# Patient Record
Sex: Female | Born: 1975 | Race: Black or African American | Hispanic: No | Marital: Single | Smoking: Never smoker
Health system: Southern US, Community
[De-identification: ages and names within clinical notes are randomized; demographics above are authoritative.]

## PROBLEM LIST (undated history)

## (undated) ENCOUNTER — Ambulatory Visit (HOSPITAL_COMMUNITY): Disposition: A | Discharge status: Other Acute Inpatient Hospital | Payer: PRIVATE HEALTH INSURANCE

## (undated) DIAGNOSIS — B009 Herpesviral infection, unspecified: Secondary | ICD-10-CM

## (undated) HISTORY — PX: BREAST BIOPSY: SHX20

## (undated) HISTORY — DX: Herpesviral infection, unspecified: B00.9

---

## 1999-09-24 ENCOUNTER — Encounter: Payer: Self-pay | Admitting: Family Medicine

## 1999-09-24 ENCOUNTER — Ambulatory Visit (HOSPITAL_COMMUNITY): Admission: RE | Admit: 1999-09-24 | Discharge: 1999-09-24 | Payer: Self-pay | Admitting: Family Medicine

## 1999-10-01 ENCOUNTER — Emergency Department (HOSPITAL_COMMUNITY): Admission: EM | Admit: 1999-10-01 | Discharge: 1999-10-02 | Payer: Self-pay | Admitting: Emergency Medicine

## 2001-02-12 ENCOUNTER — Other Ambulatory Visit: Admission: RE | Admit: 2001-02-12 | Discharge: 2001-02-12 | Payer: Self-pay | Admitting: Obstetrics and Gynecology

## 2002-05-17 ENCOUNTER — Other Ambulatory Visit: Admission: RE | Admit: 2002-05-17 | Discharge: 2002-05-17 | Payer: Self-pay | Admitting: Obstetrics and Gynecology

## 2003-11-24 ENCOUNTER — Other Ambulatory Visit: Admission: RE | Admit: 2003-11-24 | Discharge: 2003-11-24 | Payer: Self-pay | Admitting: Obstetrics and Gynecology

## 2005-04-15 ENCOUNTER — Other Ambulatory Visit: Admission: RE | Admit: 2005-04-15 | Discharge: 2005-04-15 | Payer: Self-pay | Admitting: Gynecology

## 2006-08-11 ENCOUNTER — Other Ambulatory Visit: Admission: RE | Admit: 2006-08-11 | Discharge: 2006-08-11 | Payer: Self-pay | Admitting: Gynecology

## 2006-08-24 ENCOUNTER — Emergency Department (HOSPITAL_COMMUNITY): Admission: EM | Admit: 2006-08-24 | Discharge: 2006-08-24 | Payer: Self-pay | Admitting: Emergency Medicine

## 2006-11-15 ENCOUNTER — Emergency Department (HOSPITAL_COMMUNITY): Admission: EM | Admit: 2006-11-15 | Discharge: 2006-11-15 | Payer: Self-pay | Admitting: Emergency Medicine

## 2007-12-07 ENCOUNTER — Other Ambulatory Visit: Admission: RE | Admit: 2007-12-07 | Discharge: 2007-12-07 | Payer: Self-pay | Admitting: Gynecology

## 2008-11-24 ENCOUNTER — Emergency Department (HOSPITAL_COMMUNITY): Admission: EM | Admit: 2008-11-24 | Discharge: 2008-11-24 | Payer: Self-pay | Admitting: Family Medicine

## 2008-11-28 ENCOUNTER — Ambulatory Visit: Payer: Self-pay | Admitting: Gynecology

## 2009-01-03 ENCOUNTER — Other Ambulatory Visit: Admission: RE | Admit: 2009-01-03 | Discharge: 2009-01-03 | Payer: Self-pay | Admitting: Gynecology

## 2009-01-03 ENCOUNTER — Encounter: Payer: Self-pay | Admitting: Gynecology

## 2009-01-03 ENCOUNTER — Ambulatory Visit: Payer: Self-pay | Admitting: Gynecology

## 2009-01-30 ENCOUNTER — Ambulatory Visit: Payer: Self-pay | Admitting: Gynecology

## 2009-02-14 ENCOUNTER — Ambulatory Visit: Payer: Self-pay | Admitting: Women's Health

## 2009-08-01 ENCOUNTER — Ambulatory Visit: Payer: Self-pay | Admitting: Gynecology

## 2009-10-17 ENCOUNTER — Ambulatory Visit: Payer: Self-pay | Admitting: Gynecology

## 2010-03-05 ENCOUNTER — Ambulatory Visit: Payer: Self-pay | Admitting: Gynecology

## 2010-03-05 ENCOUNTER — Other Ambulatory Visit: Admission: RE | Admit: 2010-03-05 | Discharge: 2010-03-05 | Payer: Self-pay | Admitting: Gynecology

## 2010-03-07 ENCOUNTER — Ambulatory Visit: Payer: Self-pay | Admitting: Gynecology

## 2010-07-10 ENCOUNTER — Ambulatory Visit: Payer: Self-pay | Admitting: Gynecology

## 2010-09-13 ENCOUNTER — Ambulatory Visit
Admission: RE | Admit: 2010-09-13 | Discharge: 2010-09-13 | Payer: Self-pay | Source: Home / Self Care | Attending: Gynecology | Admitting: Gynecology

## 2010-11-19 ENCOUNTER — Ambulatory Visit (INDEPENDENT_AMBULATORY_CARE_PROVIDER_SITE_OTHER): Payer: BC Managed Care – PPO | Admitting: Gynecology

## 2010-11-19 DIAGNOSIS — R823 Hemoglobinuria: Secondary | ICD-10-CM

## 2010-11-19 DIAGNOSIS — Z113 Encounter for screening for infections with a predominantly sexual mode of transmission: Secondary | ICD-10-CM

## 2010-11-19 DIAGNOSIS — N949 Unspecified condition associated with female genital organs and menstrual cycle: Secondary | ICD-10-CM

## 2010-11-19 DIAGNOSIS — N898 Other specified noninflammatory disorders of vagina: Secondary | ICD-10-CM

## 2010-11-19 DIAGNOSIS — B373 Candidiasis of vulva and vagina: Secondary | ICD-10-CM

## 2010-12-04 LAB — POCT URINALYSIS DIP (DEVICE)
Bilirubin Urine: NEGATIVE
Glucose, UA: NEGATIVE mg/dL
Nitrite: NEGATIVE
Protein, ur: 100 mg/dL — AB
Specific Gravity, Urine: >=1.03 (ref 1.005–1.030)
Urobilinogen, UA: 0.2 mg/dL (ref 0.0–1.0)
pH: 5.5 (ref 5.0–8.0)

## 2010-12-04 LAB — POCT PREGNANCY, URINE: Preg Test, Ur: NEGATIVE

## 2011-06-13 ENCOUNTER — Encounter: Payer: Self-pay | Admitting: Gynecology

## 2011-06-13 ENCOUNTER — Ambulatory Visit (INDEPENDENT_AMBULATORY_CARE_PROVIDER_SITE_OTHER): Payer: BC Managed Care – PPO | Admitting: Gynecology

## 2011-06-13 VITALS — BP 110/60

## 2011-06-13 DIAGNOSIS — N898 Other specified noninflammatory disorders of vagina: Secondary | ICD-10-CM

## 2011-06-13 DIAGNOSIS — B373 Candidiasis of vulva and vagina: Secondary | ICD-10-CM

## 2011-06-13 MED ORDER — FLUCONAZOLE 200 MG PO TABS: 200.0000 mg | ORAL_TABLET | Freq: Every day | ORAL | Status: AC

## 2011-06-13 NOTE — Progress Notes (Signed)
Patient presents complaining of several days worth of vaginal uncomfortable feeling. No real discharge or itching but said she feels like she's coming down with infection.  Exam Pelvic external BUS vagina with thick white discharge KOH wet prep done, bimanual without masses or tenderness  Wet prep positive for yeast  Assessment and plan: Yeast vaginitis treat with Diflucan 200 daily for 5 days. Patient has tried other regimens the past and this seems to work best for her. Follow up if symptoms persist or recur

## 2011-06-26 ENCOUNTER — Telehealth: Payer: Self-pay | Admitting: *Deleted

## 2011-06-26 MED ORDER — TERCONAZOLE 0.4 % VA CREA: 1.0000 | TOPICAL_CREAM | Freq: Every day | VAGINAL | Status: AC

## 2011-06-26 NOTE — Telephone Encounter (Signed)
Lm for pt to call

## 2011-06-26 NOTE — Telephone Encounter (Signed)
We will try Terazol 7 cream, 1 applicator at bedtime x7 days

## 2011-06-26 NOTE — Telephone Encounter (Signed)
Pt called stating yeast still there from last office visit on 06/13/11 she completed all the rx as directed. Pt would like something else to relieve. Please advise

## 2011-06-27 NOTE — Telephone Encounter (Signed)
Pt informed with the below note. 

## 2011-09-08 ENCOUNTER — Telehealth: Payer: Self-pay | Admitting: *Deleted

## 2011-09-08 MED ORDER — FLUCONAZOLE 200 MG PO TABS: 200.0000 mg | ORAL_TABLET | Freq: Every day | ORAL | Status: AC

## 2011-09-08 NOTE — Telephone Encounter (Signed)
Pt informed with the below note. 

## 2011-09-08 NOTE — Telephone Encounter (Signed)
Diflucan 200 mg daily x5 days. Office visit if symptoms persist 

## 2011-09-08 NOTE — Telephone Encounter (Signed)
Pt c/o yeast infection x 3 days now c/o white thick discharge. Pt has had treatment for yeast in past on 06/13/11 & 06/26/11 with Terazol 7 day cream and diflucan 150 mg # 5 tablets daily. Ov offered to pt but she said she has to work. Please advise

## 2011-11-26 ENCOUNTER — Other Ambulatory Visit: Payer: Self-pay | Admitting: Internal Medicine

## 2011-11-26 DIAGNOSIS — R300 Dysuria: Secondary | ICD-10-CM

## 2011-11-27 ENCOUNTER — Ambulatory Visit
Admission: RE | Admit: 2011-11-27 | Discharge: 2011-11-27 | Disposition: A | Discharge status: Home / Self Care | Payer: BC Managed Care – PPO | Source: Ambulatory Visit | Attending: Internal Medicine | Admitting: Internal Medicine

## 2011-11-27 DIAGNOSIS — R300 Dysuria: Secondary | ICD-10-CM

## 2013-02-23 ENCOUNTER — Ambulatory Visit (INDEPENDENT_AMBULATORY_CARE_PROVIDER_SITE_OTHER): Payer: BC Managed Care – PPO | Admitting: Gynecology

## 2013-02-23 ENCOUNTER — Other Ambulatory Visit (HOSPITAL_COMMUNITY)
Admission: RE | Admit: 2013-02-23 | Discharge: 2013-02-23 | Disposition: A | Discharge status: Home / Self Care | Payer: BC Managed Care – PPO | Source: Ambulatory Visit | Attending: Gynecology | Admitting: Gynecology

## 2013-02-23 ENCOUNTER — Encounter: Payer: Self-pay | Admitting: Gynecology

## 2013-02-23 VITALS — BP 110/60 | Ht 60.0 in | Wt 110.0 lb

## 2013-02-23 DIAGNOSIS — Z01419 Encounter for gynecological examination (general) (routine) without abnormal findings: Secondary | ICD-10-CM | POA: Insufficient documentation

## 2013-02-23 DIAGNOSIS — Z1151 Encounter for screening for human papillomavirus (HPV): Secondary | ICD-10-CM | POA: Insufficient documentation

## 2013-02-23 DIAGNOSIS — Z113 Encounter for screening for infections with a predominantly sexual mode of transmission: Secondary | ICD-10-CM

## 2013-02-23 DIAGNOSIS — Z309 Encounter for contraceptive management, unspecified: Secondary | ICD-10-CM

## 2013-02-23 MED ORDER — NORETHINDRONE ACET-ETHINYL EST 1-20 MG-MCG PO TABS: 1.0000 | ORAL_TABLET | Freq: Every day | ORAL | Status: DC

## 2013-02-23 NOTE — Patient Instructions (Addendum)
Followup if you do not start her period next week. Start on the oral contraceptives assuming you do start your period the Sunday following the start of your period. Followup if you have any issues.

## 2013-02-23 NOTE — Progress Notes (Signed)
Beth Sandoval Dec 03, 1975 [id number]        37 y.o.  G0P0 for annual exam.  Several issues noted below.  Past medical history,surgical history, medications, allergies, family history and social history were all reviewed and documented in the EPIC chart.  ROS:  Performed and pertinent positives and negatives are included in the history, assessment and plan .  Exam: Kim assistant Filed Vitals:   02/23/13 1512  BP: 110/60  Height: 5' (1.524 m)  Weight: 110 lb (49.896 kg)   General appearance  Normal Skin grossly normal Head/Neck normal with no cervical or supraclavicular adenopathy thyroid normal Lungs  clear Cardiac RR, without RMG Abdominal  soft, nontender, without masses, organomegaly or hernia Breasts  examined lying and sitting without masses, retractions, discharge or axillary adenopathy. Pelvic  Ext/BUS/vagina  normal   Cervix  normal Pap/HPV, GC/Chlamydia  Uterus  anteverted, normal size, shape and contour, midline and mobile nontender   Adnexa  Without masses or tenderness    Anus and perineum  normal      Assessment/Plan:  37 y.o. G0P0 female for annual exam.   1. Contraception. Patient had intercourse for the first time a week and a half ago and the condom broke. She did not use Plan B. Asked about pregnancy test. I've asked her to wait until her menses next week. She is still late then to followup for a pregnancy test. I reviewed that it's a little bit early right now and that may give [place] a false negative. Reviewed contraception I discussed all options to include pill patch ring Depo-Provera Implanon IUDs. Patient had taken oral contraceptives in the past and did well with these. I reviewed the risks include stroke heart attack DVT. She's not been followed for any medical issues and otherwise healthy. Loestrin 120 equivalents prescribed start after her next menses. She will again followup next week if she does not start spontaneously. 2. STD screening. Patient requests GC  chlamydia screening test to be sure. No known exposure. 3. Pap smear 2011. Pap/HPV today. No history of significant abnormal Pap smears previously. 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 She asked about transmission risk to her new partner. I reviewed that certainly she can transmit asymptomatically. The option for daily suppressive Valtrex reviewed. Issues that does not guarantee that she will not transmit despite taking the Valtrex discussed. Patient's not interested in taking at this time. 5. Mammography. Screening mammographic recommendations between 35 and 40 were reviewed. Patient has a strong family history prefers to wait closer to 40. SBE monthly reviewed. 6. Health maintenance. We'll check urinalysis. Otherwise no blood work done as it is all done through her primary physician's office. Assuming that she starts her menses and gets started on the birth control pills then she will see me in a year, sooner as needed.  Note: This document was prepared with a combination of digital dictation and possible smart phrase technology. Any transcriptional errors that result from this process are unintentional.   Dara Lords MD, 3:59 PM 02/23/2013

## 2013-02-24 ENCOUNTER — Encounter: Payer: Self-pay | Admitting: Gynecology

## 2013-02-24 LAB — URINALYSIS W MICROSCOPIC + REFLEX CULTURE
Casts: NONE SEEN
Crystals: NONE SEEN
Leukocytes, UA: NEGATIVE
Nitrite: NEGATIVE
Specific Gravity, Urine: 1.016 (ref 1.005–1.030)
Squamous Epithelial / LPF: NONE SEEN
Urobilinogen, UA: 0.2 mg/dL (ref 0.0–1.0)
pH: 6.5 (ref 5.0–8.0)

## 2013-02-24 LAB — GC/CHLAMYDIA PROBE AMP: GC Probe RNA: NEGATIVE

## 2013-07-07 ENCOUNTER — Encounter: Payer: Self-pay | Admitting: Gynecology

## 2013-07-07 ENCOUNTER — Ambulatory Visit (INDEPENDENT_AMBULATORY_CARE_PROVIDER_SITE_OTHER): Payer: BC Managed Care – PPO | Admitting: Gynecology

## 2013-07-07 DIAGNOSIS — R3129 Other microscopic hematuria: Secondary | ICD-10-CM

## 2013-07-07 DIAGNOSIS — R350 Frequency of micturition: Secondary | ICD-10-CM

## 2013-07-07 DIAGNOSIS — N898 Other specified noninflammatory disorders of vagina: Secondary | ICD-10-CM

## 2013-07-07 LAB — URINALYSIS W MICROSCOPIC + REFLEX CULTURE
Casts: NONE SEEN
Crystals: NONE SEEN
Leukocytes, UA: NEGATIVE
Nitrite: NEGATIVE
Specific Gravity, Urine: 1.025 (ref 1.005–1.030)
Urobilinogen, UA: 0.2 mg/dL (ref 0.0–1.0)
pH: 7 (ref 5.0–8.0)

## 2013-07-07 LAB — WET PREP FOR TRICH, YEAST, CLUE

## 2013-07-07 MED ORDER — METRONIDAZOLE 500 MG PO TABS: 500.0000 mg | ORAL_TABLET | Freq: Two times a day (BID) | ORAL | Status: DC

## 2013-07-07 NOTE — Patient Instructions (Signed)
Take Flagyl medication twice daily for 7 days. Avoid alcohol while taking. Repeat a clean catch urinalysis in several weeks to make sure the microscopic blood in your urine goes away.

## 2013-07-07 NOTE — Progress Notes (Signed)
Patient presents with 2 days of vaginal irritation and slight discharge. Also with some suprapubic discomfort. No dysuria frequency urgency low back pain fever chills nausea vomiting diarrhea constipation.  Exam with Berenice Bouton Spine straight with no CVA tenderness. Abdomen soft nontender without masses guarding rebound organomegaly. Pelvic external BUS vagina with white discharge. Cervix normal. Uterus normal size midline mobile nontender. Adnexa without masses or tenderness.  Assessment and plan: Wet prep consistent with bacterial vaginosis. Urinalysis shows microscopic hematuria in 7-10 RBCs per HPF many bacteria. No white cells. Will culture. Treat with Flagyl 500 mg twice a day x7 days at patient's choice. Alcohol avoidance reviewed. Followup on urine culture. I did ask her regardless to recheck a clean catch urinalysis in several weeks to make sure the microscopic hematuria clears and she agrees to do so.

## 2013-07-08 LAB — URINE CULTURE: Organism ID, Bacteria: NO GROWTH

## 2014-02-18 ENCOUNTER — Encounter (HOSPITAL_COMMUNITY): Payer: Self-pay | Admitting: Emergency Medicine

## 2014-02-18 ENCOUNTER — Emergency Department (INDEPENDENT_AMBULATORY_CARE_PROVIDER_SITE_OTHER)
Admission: EM | Admit: 2014-02-18 | Discharge: 2014-02-18 | Disposition: A | Discharge status: Home / Self Care | Payer: Self-pay | Source: Home / Self Care | Attending: Emergency Medicine | Admitting: Emergency Medicine

## 2014-02-18 DIAGNOSIS — IMO0002 Reserved for concepts with insufficient information to code with codable children: Secondary | ICD-10-CM

## 2014-02-18 DIAGNOSIS — S91109A Unspecified open wound of unspecified toe(s) without damage to nail, initial encounter: Secondary | ICD-10-CM

## 2014-02-18 DIAGNOSIS — S91209A Unspecified open wound of unspecified toe(s) with damage to nail, initial encounter: Secondary | ICD-10-CM

## 2014-02-18 MED ORDER — HYDROCODONE-ACETAMINOPHEN 5-325 MG PO TABS: ORAL_TABLET | ORAL | Status: DC

## 2014-02-18 NOTE — ED Provider Notes (Signed)
Chief Complaint   Chief Complaint  Patient presents with  . Toe Injury    History of Present Illness   Beth Sandoval is a 38 year old female who a week ago partially avulsed the toenail of her right great toe. Today she stubbed it again, reinjuring it, and avulsing if further. The toenail is very loose and it hurts to palpation. There's been a small amount of bleeding from underneath the toenail. She tried to repair this herself by gluing it back on but this did not seem to work. There is no erythema, swelling, or pain to palpation of the toe itself.  Review of Systems   Other than as noted above, the patient denies any of the following symptoms: Systemic:  No fevers or chills. Musculoskeletal:  No joint pain or arthritis.  Neurological:  No muscular weakness, paresthesias.   [place]   Past medical history, family history, social history, meds, and allergies were reviewed.     Physical  Examination     Vital signs:  BP 121/70  Pulse 68  Temp(Src) 98.4 F (36.9 C) (Oral)  Resp 20  SpO2 100%  LMP 01/20/2014 Gen:  Alert and oriented times 3.  In no distress. Musculoskeletal:  Exam of the foot reveals the right great toenail is very loose, and painful to palpation. There is no purulent drainage and no surrounding erythema.  Otherwise, all joints had a full a ROM with no swelling, bruising or deformity.  No edema, pulses full. Extremities were warm and pink.  Capillary refill was brisk.  Skin:  Clear, warm and dry.  No rash. Neuro:  Alert and oriented times 3.  Muscle strength was normal.  Sensation was intact to light touch.   Procedure Note:  Verbal informed consent was obtained from the patient.  Risks and benefits were outlined with the patient.  Patient understands and accepts these risks. A time out was called and the procedure and identity of the patient were confirmed verbally.    The procedure was then performed as follows:  The toe was prepped with alcohol and anesthetized  with a ring block with 4 injections of 2% Xylocaine without epinephrine. After satisfactory local anesthesia was obtained. The nail was elevated with nail elevator and was then completely removed with a mosquito forceps. A small amount bleeding was stopped with packing cautery. Antibiotic ointment was applied and a nonstick dressing. She was given a postoperative shoe.  The patient tolerated the procedure well without any immediate complications.   Assessment   The encounter diagnosis was Toenail avulsion, initial encounter.  No evidence of infection.  Plan    1.  Meds:  The following meds were prescribed:   Discharge Medication List as of 02/18/2014  7:31 PM    START taking these medications   Details  HYDROcodone-acetaminophen (NORCO/VICODIN) 5-325 MG per tablet 1 to 2 tabs every 4 to 6 hours as needed for pain., Print        2.  Patient Education/Counseling:  The patient was given appropriate handouts, self care instructions, and instructed in symptomatic relief including rest and elevation. She was instructed in wound care.    3.  Follow up:  The patient was told to follow up here if no better in 3 to 4 days, or sooner if becoming worse in any way, and given some red flag symptoms such as worsening pain, or any evidence of infection which would prompt immediate return.  Follow up here as necessary.  Harden Mo, MD 02/18/14 2030

## 2014-02-18 NOTE — ED Notes (Signed)
Dressing placed per Dr. Jake Michaelis.

## 2014-02-18 NOTE — Discharge Instructions (Signed)
Soak in warm [facility] water 3 times daily, apply antiobiotic ointment and a Band Aid.

## 2014-02-18 NOTE — ED Notes (Signed)
Assessment per Dr. Jake Michaelis.

## 2015-01-03 ENCOUNTER — Encounter: Payer: Self-pay | Admitting: Gynecology

## 2015-02-27 ENCOUNTER — Encounter: Payer: Self-pay | Admitting: Gynecology

## 2015-03-21 ENCOUNTER — Encounter: Payer: Self-pay | Admitting: Women's Health

## 2015-05-17 ENCOUNTER — Ambulatory Visit (INDEPENDENT_AMBULATORY_CARE_PROVIDER_SITE_OTHER): Payer: 59 | Admitting: Women's Health

## 2015-05-17 ENCOUNTER — Encounter: Payer: Self-pay | Admitting: Women's Health

## 2015-05-17 VITALS — BP 115/80 | Ht 60.0 in | Wt 115.0 lb

## 2015-05-17 DIAGNOSIS — R350 Frequency of micturition: Secondary | ICD-10-CM

## 2015-05-17 DIAGNOSIS — B373 Candidiasis of vulva and vagina: Secondary | ICD-10-CM

## 2015-05-17 DIAGNOSIS — N898 Other specified noninflammatory disorders of vagina: Secondary | ICD-10-CM | POA: Diagnosis not present

## 2015-05-17 DIAGNOSIS — B3731 Acute candidiasis of vulva and vagina: Secondary | ICD-10-CM

## 2015-05-17 LAB — URINALYSIS W MICROSCOPIC + REFLEX CULTURE
BILIRUBIN URINE: NEGATIVE
CASTS: NONE SEEN [LPF]
GLUCOSE, UA: NEGATIVE
Leukocytes, UA: NEGATIVE
Nitrite: NEGATIVE
PROTEIN: NEGATIVE
Specific Gravity, Urine: 1.02 (ref 1.001–1.035)
WBC UA: NONE SEEN WBC/HPF (ref <=5)
Yeast: NONE SEEN [HPF]
pH: 7.5 (ref 5.0–8.0)

## 2015-05-17 LAB — WET PREP FOR TRICH, YEAST, CLUE
CLUE CELLS WET PREP: NONE SEEN
TRICH WET PREP: NONE SEEN

## 2015-05-17 MED ORDER — FLUCONAZOLE 150 MG PO TABS: 150.0000 mg | ORAL_TABLET | Freq: Once | ORAL | Status: DC

## 2015-05-17 NOTE — Patient Instructions (Signed)

## 2015-05-17 NOTE — Progress Notes (Signed)
Patient ID: Beth Sandoval, female   DOB: [birth year], 39 y.o.   MRN: [id number] Presents with complaint of increased vaginal discharge with mild itching, no odor. Not sexually active in greater than one year denies need for STD screen. Regular monthly cycle. Denies urinary symptoms, abdominal pain or fever.  Exam: Appears well. External genitalia within normal limits, speculum exam moderate amount of a curdy discharge noted wet prep positive for yeast. Bimanual no CMT or adnexal tenderness. Uterus small.  Yeast vaginitis  Plan: Diflucan 150 by mouth times one dose with refill. Keep scheduled annual exam visit. yeast prevention discussed. Call if no relief.

## 2015-05-18 LAB — URINE CULTURE
COLONY COUNT: NO GROWTH
Organism ID, Bacteria: NO GROWTH

## 2015-06-13 ENCOUNTER — Ambulatory Visit (INDEPENDENT_AMBULATORY_CARE_PROVIDER_SITE_OTHER): Payer: 59 | Admitting: Women's Health

## 2015-06-13 ENCOUNTER — Encounter: Payer: Self-pay | Admitting: Women's Health

## 2015-06-13 ENCOUNTER — Other Ambulatory Visit: Payer: Self-pay | Admitting: Women's Health

## 2015-06-13 VITALS — BP 124/80 | Ht 60.0 in | Wt 115.0 lb

## 2015-06-13 DIAGNOSIS — Z01419 Encounter for gynecological examination (general) (routine) without abnormal findings: Secondary | ICD-10-CM

## 2015-06-13 LAB — LIPID PANEL
CHOL/HDL RATIO: 2.8 ratio (ref <=5.0)
Cholesterol: 178 mg/dL (ref 125–200)
HDL: 63 mg/dL (ref >=46)
LDL CALC: 106 mg/dL (ref <130)
Triglycerides: 43 mg/dL (ref <150)
VLDL: 9 mg/dL (ref <30)

## 2015-06-13 LAB — CBC WITH DIFFERENTIAL/PLATELET
BASOS ABS: 0 10*3/uL (ref 0.0–0.1)
BASOS PCT: 0 % (ref 0–1)
EOS ABS: 0 10*3/uL (ref 0.0–0.7)
EOS PCT: 1 % (ref 0–5)
HCT: 37.6 % (ref 36.0–46.0)
Hemoglobin: 11.7 g/dL — ABNORMAL LOW (ref 12.0–15.0)
LYMPHS ABS: 2.1 10*3/uL (ref 0.7–4.0)
Lymphocytes Relative: 46 % (ref 12–46)
MCH: 27.5 pg (ref 26.0–34.0)
MCHC: 31.1 g/dL (ref 30.0–36.0)
MCV: 88.3 fL (ref 78.0–100.0)
MONOS PCT: 6 % (ref 3–12)
MPV: 9.1 fL (ref 8.6–12.4)
Monocytes Absolute: 0.3 10*3/uL (ref 0.1–1.0)
NEUTROS PCT: 47 % (ref 43–77)
Neutro Abs: 2.2 10*3/uL (ref 1.7–7.7)
PLATELETS: 326 10*3/uL (ref 150–400)
RBC: 4.26 MIL/uL (ref 3.87–5.11)
RDW: 13.5 % (ref 11.5–15.5)
WBC: 4.6 10*3/uL (ref 4.0–10.5)

## 2015-06-13 LAB — COMPREHENSIVE METABOLIC PANEL
ALT: 10 U/L (ref 6–29)
AST: 15 U/L (ref 10–30)
Albumin: 4.2 g/dL (ref 3.6–5.1)
Alkaline Phosphatase: 46 U/L (ref 33–115)
BUN: 14 mg/dL (ref 7–25)
CHLORIDE: 103 mmol/L (ref 98–110)
CO2: 28 mmol/L (ref 20–31)
CREATININE: 0.81 mg/dL (ref 0.50–1.10)
Calcium: 9 mg/dL (ref 8.6–10.2)
GLUCOSE: 71 mg/dL (ref 65–99)
POTASSIUM: 4.2 mmol/L (ref 3.5–5.3)
SODIUM: 136 mmol/L (ref 135–146)
Total Bilirubin: 0.3 mg/dL (ref 0.2–1.2)
Total Protein: 7.1 g/dL (ref 6.1–8.1)

## 2015-06-13 NOTE — Progress Notes (Signed)
Beth Sandoval 08-24-1976 [id number]    History:    Presents for annual exam.  Regular monthly cycle not sexually active, waiting for marriage. Normal Pap history. Has not had a mammogram.  Past medical history, past surgical history, family history and social history were all reviewed and documented in the EPIC chart. Works in [employer]. Planning to move to [place]. Mother hypertension.  ROS:  A ROS was performed and pertinent positives and negatives are included.  Exam:  Filed Vitals:   06/13/15 1438  BP: 124/80    General appearance:  Normal Thyroid:  Symmetrical, normal in size, without palpable masses or nodularity. Respiratory  Auscultation:  Clear without wheezing or rhonchi Cardiovascular  Auscultation:  Regular rate, without rubs, murmurs or gallops  Edema/varicosities:  Not grossly evident Abdominal  Soft,nontender, without masses, guarding or rebound.  Liver/spleen:  No organomegaly noted  Hernia:  None appreciated  Skin  Inspection:  Grossly normal   Breasts: Examined lying and sitting.     Right: Without masses, retractions, discharge or axillary adenopathy.     Left: Without masses, retractions, discharge or axillary adenopathy. Gentitourinary   Inguinal/mons:  Normal without inguinal adenopathy  External genitalia:  Normal  BUS/Urethra/Skene's glands:  Normal  Vagina:  Normal  Cervix:  Normal  Uterus:   normal in size, shape and contour.  Midline and mobile  Adnexa/parametria:     Rt: Without masses or tenderness.   Lt: Without masses or tenderness.  Anus and perineum: Normal  Digital rectal exam: Normal sphincter tone without palpated masses or tenderness  Assessment/Plan:  39 y.o. SBF G0 for annual exam with no complaints.  Regular monthly cycle/not sexually active  Plan: Contraception options reviewed and declined. SBE's, annual screening mammogram, instructed to schedule breast center information given. Regular exercise, calcium rich diet, MVI  daily encouraged. CBC, lipid panel, CMP, rubella titer, UA, Pap normal with negative HR HPV 2014, new screening guidelines reviewed.  [place], 4:30 PM 06/13/2015

## 2015-06-13 NOTE — Patient Instructions (Signed)
Health Maintenance, Female Adopting a healthy lifestyle and getting preventive care can go a long way to promote health and wellness. Talk with your health care provider about what schedule of regular examinations is right for you. This is a good chance for you to check in with your provider about disease prevention and staying healthy. In between checkups, there are plenty of things you can do on your own. Experts have done a lot of research about which lifestyle changes and preventive measures are most likely to keep you healthy. Ask your health care provider for more information. WEIGHT AND DIET  Eat a healthy diet  Be sure to include plenty of vegetables, fruits, low-fat dairy products, and lean protein.  Do not eat a lot of foods high in solid fats, added sugars, or salt.  Get regular exercise. This is one of the most important things you can do for your health.  Most adults should exercise for at least 150 minutes each week. The exercise should increase your heart rate and make you sweat (moderate-intensity exercise).  Most adults should also do strengthening exercises at least twice a week. This is in addition to the moderate-intensity exercise.  Maintain a healthy weight  Body mass index (BMI) is a measurement that can be used to identify possible weight problems. It estimates body fat based on height and weight. Your health care provider can help determine your BMI and help you achieve or maintain a healthy weight.  For females 20 years of age and older:   A BMI below 18.5 is considered underweight.  A BMI of 18.5 to 24.9 is normal.  A BMI of 25 to 29.9 is considered overweight.  A BMI of 30 and above is considered obese.  Watch levels of cholesterol and blood lipids  You should start having your blood tested for lipids and cholesterol at 40 years of age, then have this test every 5 years.  You may need to have your cholesterol levels checked more often if:  Your lipid  or cholesterol levels are high.  You are older than 40 years of age.  You are at high risk for heart disease.  CANCER SCREENING   Lung Cancer  Lung cancer screening is recommended for adults 55-80 years old who are at high risk for lung cancer because of a history of smoking.  A yearly low-dose CT scan of the lungs is recommended for people who:  Currently smoke.  Have quit within the past 15 years.  Have at least a 30-pack-year history of smoking. A pack year is smoking an average of one pack of cigarettes a day for 1 year.  Yearly screening should continue until it has been 15 years since you quit.  Yearly screening should stop if you develop a health problem that would prevent you from having lung cancer treatment.  Breast Cancer  Practice breast self-awareness. This means understanding how your breasts normally appear and feel.  It also means doing regular breast self-exams. Let your health care provider know about any changes, no matter how small.  If you are in your 20s or 30s, you should have a clinical breast exam (CBE) by a health care provider every 1-3 years as part of a regular health exam.  If you are 40 or older, have a CBE every year. Also consider having a breast X-ray (mammogram) every year.  If you have a family history of breast cancer, talk to your health care provider about genetic screening.  If you   are at high risk for breast cancer, talk to your health care provider about having an MRI and a mammogram every year.  Breast cancer gene (BRCA) assessment is recommended for women who have family members with BRCA-related cancers. BRCA-related cancers include:  Breast.  Ovarian.  Tubal.  Peritoneal cancers.  Results of the assessment will determine the need for genetic counseling and BRCA1 and BRCA2 testing. Cervical Cancer Your health care provider may recommend that you be screened regularly for cancer of the pelvic organs (ovaries, uterus, and  vagina). This screening involves a pelvic examination, including checking for microscopic changes to the surface of your cervix (Pap test). You may be encouraged to have this screening done every 3 years, beginning at age 21.  For women ages 30-65, health care providers may recommend pelvic exams and Pap testing every 3 years, or they may recommend the Pap and pelvic exam, combined with testing for human papilloma virus (HPV), every 5 years. Some types of HPV increase your risk of cervical cancer. Testing for HPV may also be done on women of any age with unclear Pap test results.  Other health care providers may not recommend any screening for nonpregnant women who are considered low risk for pelvic cancer and who do not have symptoms. Ask your health care provider if a screening pelvic exam is right for you.  If you have had past treatment for cervical cancer or a condition that could lead to cancer, you need Pap tests and screening for cancer for at least 20 years after your treatment. If Pap tests have been discontinued, your risk factors (such as having a new sexual partner) need to be reassessed to determine if screening should resume. Some women have medical problems that increase the chance of getting cervical cancer. In these cases, your health care provider may recommend more frequent screening and Pap tests. Colorectal Cancer  This type of cancer can be detected and often prevented.  Routine colorectal cancer screening usually begins at 40 years of age and continues through 40 years of age.  Your health care provider may recommend screening at an earlier age if you have risk factors for colon cancer.  Your health care provider may also recommend using home test kits to check for hidden blood in the stool.  A small camera at the end of a tube can be used to examine your colon directly (sigmoidoscopy or colonoscopy). This is done to check for the earliest forms of colorectal  cancer.  Routine screening usually begins at age 50.  Direct examination of the colon should be repeated every 5-10 years through 40 years of age. However, you may need to be screened more often if early forms of precancerous polyps or small growths are found. Skin Cancer  Check your skin from head to toe regularly.  Tell your health care provider about any new moles or changes in moles, especially if there is a change in a mole's shape or color.  Also tell your health care provider if you have a mole that is larger than the size of a pencil eraser.  Always use sunscreen. Apply sunscreen liberally and repeatedly throughout the day.  Protect yourself by wearing long sleeves, pants, a wide-brimmed hat, and sunglasses whenever you are outside. HEART DISEASE, DIABETES, AND HIGH BLOOD PRESSURE   High blood pressure causes heart disease and increases the risk of stroke. High blood pressure is more likely to develop in:  People who have blood pressure in the high end   of the normal range (130-139/85-89 mm Hg).  People who are overweight or obese.  People who are African American.  If you are 38-23 years of age, have your blood pressure checked every 3-5 years. If you are 61 years of age or older, have your blood pressure checked every year. You should have your blood pressure measured twice--once when you are at a hospital or clinic, and once when you are not at a hospital or clinic. Record the average of the two measurements. To check your blood pressure when you are not at a hospital or clinic, you can use:  An automated blood pressure machine at a pharmacy.  A home blood pressure monitor.  If you are between 45 years and 39 years old, ask your health care provider if you should take aspirin to prevent strokes.  Have regular diabetes screenings. This involves taking a blood sample to check your fasting blood sugar level.  If you are at a normal weight and have a low risk for diabetes,  have this test once every three years after 40 years of age.  If you are overweight and have a high risk for diabetes, consider being tested at a younger age or more often. PREVENTING INFECTION  Hepatitis B  If you have a higher risk for hepatitis B, you should be screened for this virus. You are considered at high risk for hepatitis B if:  You were born in a country where hepatitis B is common. Ask your health care provider which countries are considered high risk.  Your parents were born in a high-risk country, and you have not been immunized against hepatitis B (hepatitis B vaccine).  You have HIV or AIDS.  You use needles to inject street drugs.  You live with someone who has hepatitis B.  You have had sex with someone who has hepatitis B.  You get hemodialysis treatment.  You take certain medicines for conditions, including cancer, organ transplantation, and autoimmune conditions. Hepatitis C  Blood testing is recommended for:  Everyone born from [birth year] through 1966.  Anyone with known risk factors for hepatitis C. Sexually transmitted infections (STIs)  You should be screened for sexually transmitted infections (STIs) including gonorrhea and chlamydia if:  You are sexually active and are younger than 40 years of age.  You are older than 40 years of age and your health care provider tells you that you are at risk for this type of infection.  Your sexual activity has changed since you were last screened and you are at an increased risk for chlamydia or gonorrhea. Ask your health care provider if you are at risk.  If you do not have HIV, but are at risk, it may be recommended that you take a prescription medicine daily to prevent HIV infection. This is called pre-exposure prophylaxis (PrEP). You are considered at risk if:  You are sexually active and do not regularly use condoms or know the HIV status of your partner(s).  You take drugs by injection.  You are sexually  active with a partner who has HIV. Talk with your health care provider about whether you are at high risk of being infected with HIV. If you choose to begin PrEP, you should first be tested for HIV. You should then be tested every 3 months for as long as you are taking PrEP.  PREGNANCY   If you are premenopausal and you may become pregnant, ask your health care provider about preconception counseling.  If you may  become pregnant, take 400 to 800 micrograms (mcg) of folic acid every day.  If you want to prevent pregnancy, talk to your health care provider about birth control (contraception). OSTEOPOROSIS AND MENOPAUSE   Osteoporosis is a disease in which the bones lose minerals and strength with aging. This can result in serious bone fractures. Your risk for osteoporosis can be identified using a bone density scan.  If you are 61 years of age or older, or if you are at risk for osteoporosis and fractures, ask your health care provider if you should be screened.  Ask your health care provider whether you should take a calcium or vitamin D supplement to lower your risk for osteoporosis.  Menopause may have certain physical symptoms and risks.  Hormone replacement therapy may reduce some of these symptoms and risks. Talk to your health care provider about whether hormone replacement therapy is right for you.  HOME CARE INSTRUCTIONS   Schedule regular health, dental, and eye exams.  Stay current with your immunizations.   Do not use any tobacco products including cigarettes, chewing tobacco, or electronic cigarettes.  If you are pregnant, do not drink alcohol.  If you are breastfeeding, limit how much and how often you drink alcohol.  Limit alcohol intake to no more than 1 drink per day for nonpregnant women. One drink equals 12 ounces of beer, 5 ounces of wine, or 1 ounces of hard liquor.  Do not use street drugs.  Do not share needles.  Ask your health care provider for help if  you need support or information about quitting drugs.  Tell your health care provider if you often feel depressed.  Tell your health care provider if you have ever been abused or do not feel safe at home.   This information is not intended to replace advice given to you by your health care provider. Make sure you discuss any questions you have with your health care provider.   Document Released: 02/24/2011 Document Revised: 09/01/2014 Document Reviewed: 07/13/2013 Elsevier Interactive Patient Education [employer].

## 2015-06-14 LAB — URINALYSIS W MICROSCOPIC + REFLEX CULTURE
BACTERIA UA: NONE SEEN [HPF]
BILIRUBIN URINE: NEGATIVE
Casts: NONE SEEN [LPF]
Crystals: NONE SEEN [HPF]
GLUCOSE, UA: NEGATIVE
Hgb urine dipstick: NEGATIVE
Ketones, ur: NEGATIVE
LEUKOCYTES UA: NEGATIVE
Nitrite: NEGATIVE
PROTEIN: NEGATIVE
SPECIFIC GRAVITY, URINE: 1.027 (ref 1.001–1.035)
Yeast: NONE SEEN [HPF]
pH: 5.5 (ref 5.0–8.0)

## 2015-06-15 LAB — URINE CULTURE
Colony Count: NO GROWTH
Organism ID, Bacteria: NO GROWTH

## 2015-06-15 LAB — RUBELLA SCREEN: Rubella: 3.76 Index — ABNORMAL HIGH (ref <0.90)

## 2015-12-18 ENCOUNTER — Other Ambulatory Visit: Payer: Self-pay | Admitting: Women's Health

## 2015-12-18 ENCOUNTER — Ambulatory Visit (INDEPENDENT_AMBULATORY_CARE_PROVIDER_SITE_OTHER): Payer: 59 | Admitting: Women's Health

## 2015-12-18 ENCOUNTER — Encounter: Payer: Self-pay | Admitting: Women's Health

## 2015-12-18 VITALS — BP 126/80 | Ht 60.0 in | Wt 115.0 lb

## 2015-12-18 DIAGNOSIS — N63 Unspecified lump in breast: Secondary | ICD-10-CM | POA: Diagnosis not present

## 2015-12-18 DIAGNOSIS — N6310 Unspecified lump in the right breast, unspecified quadrant: Secondary | ICD-10-CM

## 2015-12-18 NOTE — Patient Instructions (Signed)
Fibroadenoma Fibroadenoma is a type of breast tumor that is not cancerous (is benign). These tumors are made up of breast tissue and the tissue that holds breast tissue together (connective tissue). There are several types of fibroadenomas:  Simple fibroadenoma. This is the most common type. It consists of a single type of tissue throughout the tumor.  Complex fibroadenoma. This type of tumor contains more than one kind of tissue or irregular tissue.  Juvenile fibroadenoma. This is a type of tumor that can develop in adolescent girls. It tends to grow larger over time than other adenomas. A fibroadenoma usually occurs as a single lump, but sometimes there may be more than one lump. Fibroadenomas vary in size. They can occur in one breast or in both breasts. Some fibroadenomas are too small to feel, but a larger one may feel like a firm, smooth lump that moves beneath your fingers. Although fibroadenomas are not cancer, having a fibroadenoma may slightly increase your risk for developing breast cancer in the future. CAUSES The exact cause of fibroadenoma is not known. RISK FACTORS This condition is more likely to develop in:  Women who are 20-30 years of age.  Women of African-American descent. SYMPTOMS A fibroadenoma may not cause any symptoms. These tumors usually do not cause pain unless they grow to a large size. A fibroadenoma may feel like a lump in your breast that is:  Firm.  Round.  Smooth.  Slightly moveable. DIAGNOSIS You may notice a breast lump during a breast self-exam. Your health care provider may discover it during a routine breast exam or mammogram. Your health care provider may suspect fibroadenoma if you have a breast lump that feels firm, round, and smooth and appears smooth on your mammogram. Other tests may be done to confirm the diagnosis, including:  An ultrasound to check for fluid inside the lump (cystic tumor).  A procedure that uses a needle to remove  fluid from a cystic tumor. The fluid is then checked under a microscope for cancer cells.  A mammogram to examine a lump that is not cystic (is solid).  A procedure that uses a needle to remove a sample of tissue from the lump (breast biopsy) to examine under a microscope. This test is the only method that can be used to confirm that a tumor is a fibroadenoma and is not cancer. TREATMENT Treatment for this condition may include:  Having breast exams regularly to check for changes in your fibroadenoma.  Having the fibroadenoma removed. A fibroadenoma may be removed if it is:  Large.  Continuing to grow.  Causing symptoms.  Changing the appearance of your breast.  A juvenile fibroadenoma. These tend to grow large over time. HOME CARE INSTRUCTIONS  If you had a fibroadenoma removed, follow instructions from your health care provider for care after the procedure.  Perform breast self-exams at home as told by your health care provider.  Keep all follow-up visits as told by your health care provider. This is important. SEEK MEDICAL CARE IF:  Your fibroadenoma becomes larger, feels different, or becomes painful.  You find a new breast lump.  You have any changes in the skin that covers your breast. These include:  Dimpling.  Bruising.  Thickening.  Redness.  You have any changes in your nipple.  You have fluid leaking from your nipple.   This information is not intended to replace advice given to you by your health care provider. Make sure you discuss any questions you have with   your health care provider.   Document Released: 12/26/2014 Document Reviewed: 12/26/2014 Elsevier Interactive Patient Education 2016 Elsevier Inc.  

## 2015-12-18 NOTE — Progress Notes (Signed)
Patient ID: Beverely Pace, female   DOB: [birth year], 40 y.o.   MRN: [id number] Presents with complaint of slightly tender right breast "cyst". Noticed approximately one week ago, cycle due this week. Has not had a baseline or annual screening mammogram. Regular monthly cycle/not sexually active. Paternal grandmother breast cancer after menopause. Mother history of cystic breasts.  Exam: Appears well. Breast exam in sitting and lying position without visible dimpling, retractions or nipple discharge, right breast palpable 5 cm smooth mobile slightly tender mass at 10:00 position. Both breast fibroglandular.  Probable fibroadenoma  Plan: Diagnostic mammogram with ultrasound, scheduled for April 26. Reviewed importance of continuing SBE's. Decrease amount of daily caffeine in diet.

## 2015-12-20 ENCOUNTER — Other Ambulatory Visit: Payer: Self-pay | Admitting: Women's Health

## 2015-12-20 ENCOUNTER — Ambulatory Visit
Admission: RE | Admit: 2015-12-20 | Discharge: 2015-12-20 | Disposition: A | Discharge status: Home / Self Care | Payer: 59 | Source: Ambulatory Visit | Attending: Women's Health | Admitting: Women's Health

## 2015-12-20 DIAGNOSIS — N6310 Unspecified lump in the right breast, unspecified quadrant: Secondary | ICD-10-CM

## 2015-12-20 HISTORY — PX: BREAST CYST ASPIRATION: SHX578

## 2016-01-18 ENCOUNTER — Telehealth: Payer: Self-pay | Admitting: *Deleted

## 2016-01-18 NOTE — Telephone Encounter (Signed)
Pt called c/o yeast infection itching, I advised pt to try OTC monistat, pt will try and follow up if no relief

## 2016-12-30 ENCOUNTER — Ambulatory Visit (INDEPENDENT_AMBULATORY_CARE_PROVIDER_SITE_OTHER): Payer: PRIVATE HEALTH INSURANCE | Admitting: Women's Health

## 2016-12-30 ENCOUNTER — Encounter: Payer: Self-pay | Admitting: Women's Health

## 2016-12-30 ENCOUNTER — Ambulatory Visit: Payer: 59 | Admitting: Women's Health

## 2016-12-30 VITALS — BP 120/82

## 2016-12-30 DIAGNOSIS — N6310 Unspecified lump in the right breast, unspecified quadrant: Secondary | ICD-10-CM | POA: Diagnosis not present

## 2016-12-30 NOTE — Progress Notes (Signed)
Presents with tender right breast lump for the past week, currently on menstrual cycle. History of benign breast cysts 11/2015. Denies any change in routine, injury, nipple discharge, or family history of breast cancer. Monthly cycles, not sexually active. Minimal caffeine in diet. 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  Exam: Appears well. Breast exam and sitting and lying position right breast at 10:00 position mobile 2 cm oval smooth slightly tender mass.  Both breasts fibroglandular tissue.  Right breast mass/history of breast cysts  Plan: Diagnostic right breast mammogram with ultrasound. We'll get scheduled. Keep scheduled annual exam office visit. Had been in the process of moving to [place], plans changed.

## 2016-12-30 NOTE — Patient Instructions (Signed)
Breast Cyst A breast cyst is a sac in the breast that is filled with fluid. Breast cysts are usually noncancerous (benign). They are common among women, and they are most often located in the upper, outer portion of the breast. One or more cysts may develop. They form when fluid builds up inside of the breast glands. There are several types of breast cysts:  Macrocyst. This is a cyst that is about 2 inches (5.1 cm) across (in diameter).  Microcyst. This is a very small cyst that you cannot feel, but it can be seen with imaging tests such as an X-ray of the breast (mammogram) or ultrasound.  Galactocele. This is a cyst that contains milk. It may develop if you suddenly stop breastfeeding.  Breast cysts do not increase your risk of breast cancer. They usually disappear after menopause, unless you take artificial hormones (are on hormone therapy). What are the causes? The exact cause of breast cysts is not known. Possible causes include:  Blockage of tubes (ducts) in the breast glands, which leads to fluid buildup. Duct blockage may result from: ? Fibrocystic breast changes. This is a common, benign condition that occurs when women go through hormonal changes during the menstrual cycle. This is a common cause of multiple breast cysts. ? Overgrowth of breast tissue or breast glands. ? Scar tissue in the breast from previous surgery.  Changes in certain female hormones (estrogen and progesterone).  What increases the risk? You may be more likely to develop breast cysts if you have not gone through menopause. What are the signs or symptoms? Symptoms of a breast cyst may include:  Feeling one or more smooth, round, soft lumps (like grapes) in the breast that are easily moveable. The lump(s) may get bigger and more painful before your period and get smaller after your period.  Breast discomfort or pain.  How is this diagnosed? A cyst can be felt during a physical exam by your health care  provider. A mammogram and ultrasound will be done to confirm the diagnosis. Fluid may be removed from the cyst with a needle (fine-needle aspiration) and tested to make sure the cyst is not cancerous. How is this treated? Treatment may not be necessary. Your health care provider may monitor the cyst to see if it goes away on its own. If the cyst is uncomfortable or gets bigger, or if you do not like how the cyst makes your breast look, you may need treatment. Treatment may include:  Hormone treatment.  Fine-needle aspiration, to drain fluid from the cyst. There is a chance of the cyst coming back (recurring) after aspiration.  Surgery to remove the cyst.  Follow these instructions at home:  See your health care provider regularly. ? Get a yearly physical exam. ? If you are 20-40 years old, get a clinical breast exam every 1-3 years. After age 40, get this exam every year. ? Get mammograms as often as directed.  Do a breast self-exam every month, or as often as directed. Having many breast cysts, or "lumpy" breasts, may make it harder to feel for new lumps. Understand how your breasts normally look and feel, and write down any changes in your breasts so you can tell your health care provider about the changes. A breast self-exam involves: ? Comparing your breasts in the mirror. ? Looking for visible changes in your skin or nipples. ? Feeling for lumps or changes.  Take over-the-counter and prescription medicines only as told by your health care   provider.  Wear a supportive bra, especially when exercising.  Follow instructions from your health care provider about eating and drinking restrictions. ? Avoid caffeine. ? Cut down on salt (sodium) in what you eat and drink, especially before your menstrual period. Too much sodium can cause fluid buildup (retention), breast swelling, and discomfort.  Keep all follow-up visits as told your health care provider. This is important. Contact a  health care provider if:  You feel, or think you feel, a lump in your breast.  You notice that both breasts look or feel different than usual.  Your breast is still causing pain after your menstrual period is over.  You find new lumps or bumps that were not there before.  You feel lumps in your armpit (axilla). Get help right away if:  You have severe pain, tenderness, redness, or warmth in your breast.  You have fluid or blood leaking from your nipple.  Your breast lump becomes hard and painful.  You notice dimpling or wrinkling of the breast or nipple. This information is not intended to replace advice given to you by your health care provider. Make sure you discuss any questions you have with your health care provider. Document Released: 08/11/2005 Document Revised: 05/02/2016 Document Reviewed: 05/02/2016 Elsevier Interactive Patient Education  2017 Elsevier Inc.  

## 2016-12-31 ENCOUNTER — Telehealth: Payer: Self-pay | Admitting: *Deleted

## 2016-12-31 DIAGNOSIS — N63 Unspecified lump in unspecified breast: Secondary | ICD-10-CM

## 2016-12-31 NOTE — Telephone Encounter (Signed)
-----   Message from Huel Cote, NP sent at 12/30/2016  4:27 PM EDT ----- Needs right breast diagnostic mammogram with [place], anytime ok, has been to breast center, 2cm mobile smooth, 10:00

## 2016-12-31 NOTE — Telephone Encounter (Signed)
Pt scheduled on 01/05/17 @ 9:10am at breast center,pt aware.

## 2017-01-05 ENCOUNTER — Ambulatory Visit
Admission: RE | Admit: 2017-01-05 | Discharge: 2017-01-05 | Disposition: A | Discharge status: Home / Self Care | Payer: PRIVATE HEALTH INSURANCE | Source: Ambulatory Visit | Attending: Women's Health | Admitting: Women's Health

## 2017-01-05 DIAGNOSIS — N63 Unspecified lump in unspecified breast: Secondary | ICD-10-CM

## 2017-01-12 ENCOUNTER — Encounter: Payer: Self-pay | Admitting: Women's Health

## 2017-01-12 ENCOUNTER — Ambulatory Visit (INDEPENDENT_AMBULATORY_CARE_PROVIDER_SITE_OTHER): Payer: PRIVATE HEALTH INSURANCE | Admitting: Women's Health

## 2017-01-12 VITALS — BP 118/80 | Ht 60.0 in | Wt 115.0 lb

## 2017-01-12 DIAGNOSIS — N6012 Diffuse cystic mastopathy of left breast: Secondary | ICD-10-CM

## 2017-01-12 DIAGNOSIS — Z13220 Encounter for screening for lipoid disorders: Secondary | ICD-10-CM

## 2017-01-12 DIAGNOSIS — Z01419 Encounter for gynecological examination (general) (routine) without abnormal findings: Secondary | ICD-10-CM | POA: Diagnosis not present

## 2017-01-12 DIAGNOSIS — N6011 Diffuse cystic mastopathy of right breast: Secondary | ICD-10-CM | POA: Diagnosis not present

## 2017-01-12 LAB — CBC WITH DIFFERENTIAL/PLATELET
BASOS PCT: 0 %
Basophils Absolute: 0 cells/uL (ref 0–200)
EOS ABS: 33 {cells}/uL (ref 15–500)
Eosinophils Relative: 1 %
HEMATOCRIT: 41.5 % (ref 35.0–45.0)
HEMOGLOBIN: 12.8 g/dL (ref 11.7–15.5)
LYMPHS PCT: 44 %
Lymphs Abs: 1452 cells/uL (ref 850–3900)
MCH: 28.1 pg (ref 27.0–33.0)
MCHC: 30.8 g/dL — ABNORMAL LOW (ref 32.0–36.0)
MCV: 91 fL (ref 80.0–100.0)
MONO ABS: 231 {cells}/uL (ref 200–950)
MPV: 8.9 fL (ref 7.5–12.5)
Monocytes Relative: 7 %
Neutro Abs: 1584 cells/uL (ref 1500–7800)
Neutrophils Relative %: 48 %
Platelets: 286 10*3/uL (ref 140–400)
RBC: 4.56 MIL/uL (ref 3.80–5.10)
RDW: 13.2 % (ref 11.0–15.0)
WBC: 3.3 10*3/uL — ABNORMAL LOW (ref 3.8–10.8)

## 2017-01-12 LAB — URINALYSIS W MICROSCOPIC + REFLEX CULTURE
Bilirubin Urine: NEGATIVE
CASTS: NONE SEEN [LPF]
GLUCOSE, UA: NEGATIVE
Hgb urine dipstick: NEGATIVE
KETONES UR: NEGATIVE
LEUKOCYTES UA: NEGATIVE
NITRITE: NEGATIVE
PH: 6 (ref 5.0–8.0)
Protein, ur: NEGATIVE
RBC / HPF: NONE SEEN RBC/HPF (ref <=2)
SPECIFIC GRAVITY, URINE: 1.026 (ref 1.001–1.035)
Yeast: NONE SEEN [HPF]

## 2017-01-12 NOTE — Patient Instructions (Signed)
Health Maintenance, Female Adopting a healthy lifestyle and getting preventive care can go a long way to promote health and wellness. Talk with your health care provider about what schedule of regular examinations is right for you. This is a good chance for you to check in with your provider about disease prevention and staying healthy. In between checkups, there are plenty of things you can do on your own. Experts have done a lot of research about which lifestyle changes and preventive measures are most likely to keep you healthy. Ask your health care provider for more information. Weight and diet Eat a healthy diet  Be sure to include plenty of vegetables, fruits, low-fat dairy products, and lean protein.  Do not eat a lot of foods high in solid fats, added sugars, or salt.  Get regular exercise. This is one of the most important things you can do for your health.  Most adults should exercise for at least 150 minutes each week. The exercise should increase your heart rate and make you sweat (moderate-intensity exercise).  Most adults should also do strengthening exercises at least twice a week. This is in addition to the moderate-intensity exercise. Maintain a healthy weight  Body mass index (BMI) is a measurement that can be used to identify possible weight problems. It estimates body fat based on height and weight. Your health care provider can help determine your BMI and help you achieve or maintain a healthy weight.  For females 42 years of age and older:  A BMI below 18.5 is considered underweight.  A BMI of 18.5 to 24.9 is normal.  A BMI of 25 to 29.9 is considered overweight.  A BMI of 30 and above is considered obese. Watch levels of cholesterol and blood lipids  You should start having your blood tested for lipids and cholesterol at 42 years of age, then have this test every 5 years.  You may need to have your cholesterol levels checked more often if:  Your lipid or  cholesterol levels are high.  You are older than 42 years of age.  You are at high risk for heart disease. Cancer screening Lung Cancer  Lung cancer screening is recommended for adults 64-42 years old who are at high risk for lung cancer because of a history of smoking.  A yearly low-dose CT scan of the lungs is recommended for people who:  Currently smoke.  Have quit within the past 15 years.  Have at least a 30-pack-year history of smoking. A pack year is smoking an average of one pack of cigarettes a day for 1 year.  Yearly screening should continue until it has been 15 years since you quit.  Yearly screening should stop if you develop a health problem that would prevent you from having lung cancer treatment. Breast Cancer  Practice breast self-awareness. This means understanding how your breasts normally appear and feel.  It also means doing regular breast self-exams. Let your health care provider know about any changes, no matter how small.  If you are in your 20s or 30s, you should have a clinical breast exam (CBE) by a health care provider every 1-3 years as part of a regular health exam.  If you are 34 or older, have a CBE every year. Also consider having a breast X-ray (mammogram) every year.  If you have a family history of breast cancer, talk to your health care provider about genetic screening.  If you are at high risk for breast cancer, talk  to your health care provider about having an MRI and a mammogram every year.  Breast cancer gene (BRCA) assessment is recommended for women who have family members with BRCA-related cancers. BRCA-related cancers include:  Breast.  Ovarian.  Tubal.  Peritoneal cancers.  Results of the assessment will determine the need for genetic counseling and BRCA1 and BRCA2 testing. Cervical Cancer  Your health care provider may recommend that you be screened regularly for cancer of the pelvic organs (ovaries, uterus, and vagina).  This screening involves a pelvic examination, including checking for microscopic changes to the surface of your cervix (Pap test). You may be encouraged to have this screening done every 3 years, beginning at age 24.  For women ages 66-65, health care providers may recommend pelvic exams and Pap testing every 3 years, or they may recommend the Pap and pelvic exam, combined with testing for human papilloma virus (HPV), every 5 years. Some types of HPV increase your risk of cervical cancer. Testing for HPV may also be done on women of any age with unclear Pap test results.  Other health care providers may not recommend any screening for nonpregnant women who are considered low risk for pelvic cancer and who do not have symptoms. Ask your health care provider if a screening pelvic exam is right for you.  If you have had past treatment for cervical cancer or a condition that could lead to cancer, you need Pap tests and screening for cancer for at least 20 years after your treatment. If Pap tests have been discontinued, your risk factors (such as having a new sexual partner) need to be reassessed to determine if screening should resume. Some women have medical problems that increase the chance of getting cervical cancer. In these cases, your health care provider may recommend more frequent screening and Pap tests. Colorectal Cancer  This type of cancer can be detected and often prevented.  Routine colorectal cancer screening usually begins at 42 years of age and continues through 42 years of age.  Your health care provider may recommend screening at an earlier age if you have risk factors for colon cancer.  Your health care provider may also recommend using home test kits to check for hidden blood in the stool.  A small camera at the end of a tube can be used to examine your colon directly (sigmoidoscopy or colonoscopy). This is done to check for the earliest forms of colorectal cancer.  Routine  screening usually begins at age 41.  Direct examination of the colon should be repeated every 5-10 years through 42 years of age. However, you may need to be screened more often if early forms of precancerous polyps or small growths are found. Skin Cancer  Check your skin from head to toe regularly.  Tell your health care provider about any new moles or changes in moles, especially if there is a change in a mole's shape or color.  Also tell your health care provider if you have a mole that is larger than the size of a pencil eraser.  Always use sunscreen. Apply sunscreen liberally and repeatedly throughout the day.  Protect yourself by wearing long sleeves, pants, a wide-brimmed hat, and sunglasses whenever you are outside. Heart disease, diabetes, and high blood pressure  High blood pressure causes heart disease and increases the risk of stroke. High blood pressure is more likely to develop in:  People who have blood pressure in the high end of the normal range (130-139/85-89 mm Hg).  People who are overweight or obese.  People who are African American.  If you are 59-24 years of age, have your blood pressure checked every 3-5 years. If you are 34 years of age or older, have your blood pressure checked every year. You should have your blood pressure measured twice-once when you are at a hospital or clinic, and once when you are not at a hospital or clinic. Record the average of the two measurements. To check your blood pressure when you are not at a hospital or clinic, you can use:  An automated blood pressure machine at a pharmacy.  A home blood pressure monitor.  If you are between 29 years and 60 years old, ask your health care provider if you should take aspirin to prevent strokes.  Have regular diabetes screenings. This involves taking a blood sample to check your fasting blood sugar level.  If you are at a normal weight and have a low risk for diabetes, have this test once  every three years after 42 years of age.  If you are overweight and have a high risk for diabetes, consider being tested at a younger age or more often. Preventing infection Hepatitis B  If you have a higher risk for hepatitis B, you should be screened for this virus. You are considered at high risk for hepatitis B if:  You were born in a country where hepatitis B is common. Ask your health care provider which countries are considered high risk.  Your parents were born in a high-risk country, and you have not been immunized against hepatitis B (hepatitis B vaccine).  You have HIV or AIDS.  You use needles to inject street drugs.  You live with someone who has hepatitis B.  You have had sex with someone who has hepatitis B.  You get hemodialysis treatment.  You take certain medicines for conditions, including cancer, organ transplantation, and autoimmune conditions. Hepatitis C  Blood testing is recommended for:  Everyone born from [birth year] through 1966.  Anyone with known risk factors for hepatitis C. Sexually transmitted infections (STIs)  You should be screened for sexually transmitted infections (STIs) including gonorrhea and chlamydia if:  You are sexually active and are younger than 42 years of age.  You are older than 42 years of age and your health care provider tells you that you are at risk for this type of infection.  Your sexual activity has changed since you were last screened and you are at an increased risk for chlamydia or gonorrhea. Ask your health care provider if you are at risk.  If you do not have HIV, but are at risk, it may be recommended that you take a prescription medicine daily to prevent HIV infection. This is called pre-exposure prophylaxis (PrEP). You are considered at risk if:  You are sexually active and do not regularly use condoms or know the HIV status of your partner(s).  You take drugs by injection.  You are sexually active with a partner  who has HIV. Talk with your health care provider about whether you are at high risk of being infected with HIV. If you choose to begin PrEP, you should first be tested for HIV. You should then be tested every 3 months for as long as you are taking PrEP. Pregnancy  If you are premenopausal and you may become pregnant, ask your health care provider about preconception counseling.  If you may become pregnant, take 400 to 800 micrograms (mcg) of folic acid  every day.  If you want to prevent pregnancy, talk to your health care provider about birth control (contraception). Osteoporosis and menopause  Osteoporosis is a disease in which the bones lose minerals and strength with aging. This can result in serious bone fractures. Your risk for osteoporosis can be identified using a bone density scan.  If you are 4 years of age or older, or if you are at risk for osteoporosis and fractures, ask your health care provider if you should be screened.  Ask your health care provider whether you should take a calcium or vitamin D supplement to lower your risk for osteoporosis.  Menopause may have certain physical symptoms and risks.  Hormone replacement therapy may reduce some of these symptoms and risks. Talk to your health care provider about whether hormone replacement therapy is right for you. Follow these instructions at home:  Schedule regular health, dental, and eye exams.  Stay current with your immunizations.  Do not use any tobacco products including cigarettes, chewing tobacco, or electronic cigarettes.  If you are pregnant, do not drink alcohol.  If you are breastfeeding, limit how much and how often you drink alcohol.  Limit alcohol intake to no more than 1 drink per day for nonpregnant women. One drink equals 12 ounces of beer, 5 ounces of wine, or 1 ounces of hard liquor.  Do not use street drugs.  Do not share needles.  Ask your health care provider for help if you need support  or information about quitting drugs.  Tell your health care provider if you often feel depressed.  Tell your health care provider if you have ever been abused or do not feel safe at home. This information is not intended to replace advice given to you by your health care provider. Make sure you discuss any questions you have with your health care provider. Document Released: 02/24/2011 Document Revised: 01/17/2016 Document Reviewed: 05/15/2015 Elsevier Interactive Patient Education  2017 [employer].

## 2017-01-12 NOTE — Progress Notes (Signed)
Beth Sandoval [birth year] [id number]    History:    Presents for annual exam.  Regular monthly cycle/not sexually active in years. Normal Pap history. Mammogram this month showing multiple benign appearing cysts. History of HSV with no outbreaks/found on blood STD screen.  Past medical history, past surgical history, family history and social history were all reviewed and documented in the EPIC chart. 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  ROS:  A ROS was performed and pertinent positives and negatives are included.  Exam:  Vitals:   01/12/17 0842  BP: 118/80  Weight: 115 lb (52.2 kg)  Height: 5' (1.524 m)   Body mass index is 22.46 kg/m.   General appearance:  Normal Thyroid:  Symmetrical, normal in size, without palpable masses or nodularity. Respiratory  Auscultation:  Clear without wheezing or rhonchi Cardiovascular  Auscultation:  Regular rate, without rubs, murmurs or gallops  Edema/varicosities:  Not grossly evident Abdominal  Soft,nontender, without masses, guarding or rebound.  Liver/spleen:  No organomegaly noted  Hernia:  None appreciated  Skin  Inspection:  Grossly normal   Breasts: Examined lying and sitting. Bilateral smooth mobile cyst/fibroglandular    Right: Without masses, retractions, discharge or axillary adenopathy.     Left: Without masses, retractions, discharge or axillary adenopathy. Gentitourinary   Inguinal/mons:  Normal without inguinal adenopathy  External genitalia:  Normal  BUS/Urethra/Skene's glands:  Normal  Vagina:  Normal  Cervix:  Normal  Uterus:  normal in size, shape and contour.  Midline and mobile  Adnexa/parametria:     Rt: Without masses or tenderness.   Lt: Without masses or tenderness.  Anus and perineum: Normal  Digital rectal exam: Normal sphincter tone without palpated masses or tenderness  Assessment/Plan:  41 y.o. SBF G0  for annual exam with no complaints.  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 SBE's, continue annual 3-D screening mammogram history of dense breasts with benign appearing cysts. Exercise, calcium rich diet, less than 20 g saturated fat daily, MVI daily encouraged. CBC, lipid panel, CMP, UA, Pap with HR HPV typing, new screening guidelines reviewed.  Plan:  [facility], 9:39 AM 01/12/2017

## 2017-01-13 LAB — COMPREHENSIVE METABOLIC PANEL
ALT: 8 U/L (ref 6–29)
AST: 14 U/L (ref 10–30)
Albumin: 4.2 g/dL (ref 3.6–5.1)
Alkaline Phosphatase: 42 U/L (ref 33–115)
BILIRUBIN TOTAL: 0.4 mg/dL (ref 0.2–1.2)
BUN: 14 mg/dL (ref 7–25)
CALCIUM: 9.2 mg/dL (ref 8.6–10.2)
CO2: 23 mmol/L (ref 20–31)
Chloride: 104 mmol/L (ref 98–110)
Creat: 0.92 mg/dL (ref 0.50–1.10)
GLUCOSE: 59 mg/dL — AB (ref 65–99)
POTASSIUM: 4 mmol/L (ref 3.5–5.3)
Sodium: 137 mmol/L (ref 135–146)
TOTAL PROTEIN: 7.3 g/dL (ref 6.1–8.1)

## 2017-01-13 LAB — PAP, TP IMAGING W/ HPV RNA, RFLX HPV TYPE 16,18/45: HPV MRNA, HIGH RISK: NOT DETECTED

## 2017-01-13 LAB — LIPID PANEL
CHOLESTEROL: 198 mg/dL (ref <200)
HDL: 64 mg/dL (ref >50)
LDL Cholesterol: 119 mg/dL — ABNORMAL HIGH (ref <100)
Total CHOL/HDL Ratio: 3.1 Ratio (ref <5.0)
Triglycerides: 76 mg/dL (ref <150)
VLDL: 15 mg/dL (ref <30)

## 2017-01-13 LAB — URINE CULTURE: ORGANISM ID, BACTERIA: NO GROWTH

## 2017-02-03 ENCOUNTER — Ambulatory Visit (INDEPENDENT_AMBULATORY_CARE_PROVIDER_SITE_OTHER): Payer: PRIVATE HEALTH INSURANCE | Admitting: Podiatry

## 2017-02-03 ENCOUNTER — Encounter: Payer: Self-pay | Admitting: Podiatry

## 2017-02-03 DIAGNOSIS — M2011 Hallux valgus (acquired), right foot: Secondary | ICD-10-CM | POA: Diagnosis not present

## 2017-02-03 DIAGNOSIS — L603 Nail dystrophy: Secondary | ICD-10-CM

## 2017-02-03 DIAGNOSIS — M2012 Hallux valgus (acquired), left foot: Secondary | ICD-10-CM | POA: Diagnosis not present

## 2017-02-03 NOTE — Progress Notes (Signed)
   Subjective:    Patient ID: Beth Sandoval, female    DOB: [birth year], 41 y.o.   MRN: [id number]  HPI: She presents today with a chief concern of a thick discolored dystrophic nail hallux right. States that she injured the nail about a year ago and the nail was removed. She states that it grew back thick and brittle and dark tender at times and she is really done nothing to try to treat it but she would like to consider therapy. She states that she just cut the nail back as far as she could.    Review of Systems  All other systems reviewed and are negative.      Objective:   Physical Exam: Stable She Is Alert and Oriented 3. Pulses Are Palpable. Neurologic Symptoms Intact. Degenerative Flexors Are Intact. With Evaluation of His Result Was Distal to the Angle Full Range Of Motion without Crepitation. Cutaneous Evaluation X-Rays of the Well-Hydrated Cutis No Erythema Edema Cellulitis Drainage or Odor. 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 There Does Appear to Be Some Possible Tinea Pedis Bilateral She Also Has Pes Planus Bilateral.        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  Plan: She is to allow this to grow out and take a sample of the nail while she is at home. She will bring it to me and we will send this off for pathologic evaluation. I will follow-up with her in 1 month or 2 months.

## 2017-03-17 ENCOUNTER — Telehealth: Payer: Self-pay | Admitting: *Deleted

## 2017-03-17 DIAGNOSIS — Z131 Encounter for screening for diabetes mellitus: Secondary | ICD-10-CM

## 2017-03-17 NOTE — Telephone Encounter (Signed)
Please call and have her avoid simple sugars such as chocolate, desserts. Best to check a hemoglobin A1c. Her blood sugar was slightly low when it was checked last. Eating small frequent meals would be best.

## 2017-03-17 NOTE — Telephone Encounter (Signed)
Pt called to follow up low glucose level, pt c/o fatigue,c/o having headaches after eating chocolate, dizzy spells and lightheaded off and on. Asked if lab should be rechecked included a repeat glucose level? Please advise

## 2017-03-18 NOTE — Telephone Encounter (Signed)
Left message for pt to call.

## 2017-03-23 NOTE — Telephone Encounter (Signed)
Pt informed, order placed. Pt will call back to schedule lab appointment

## 2017-04-07 ENCOUNTER — Ambulatory Visit: Payer: PRIVATE HEALTH INSURANCE | Admitting: Podiatry

## 2017-05-26 ENCOUNTER — Ambulatory Visit: Payer: PRIVATE HEALTH INSURANCE | Admitting: Podiatry

## 2017-05-27 ENCOUNTER — Encounter: Payer: Self-pay | Admitting: Women's Health

## 2017-05-27 ENCOUNTER — Ambulatory Visit (INDEPENDENT_AMBULATORY_CARE_PROVIDER_SITE_OTHER): Payer: PRIVATE HEALTH INSURANCE | Admitting: Women's Health

## 2017-05-27 VITALS — BP 112/70

## 2017-05-27 DIAGNOSIS — N760 Acute vaginitis: Secondary | ICD-10-CM | POA: Diagnosis not present

## 2017-05-27 DIAGNOSIS — B9689 Other specified bacterial agents as the cause of diseases classified elsewhere: Secondary | ICD-10-CM

## 2017-05-27 DIAGNOSIS — Z113 Encounter for screening for infections with a predominantly sexual mode of transmission: Secondary | ICD-10-CM | POA: Diagnosis not present

## 2017-05-27 LAB — WET PREP FOR TRICH, YEAST, CLUE

## 2017-05-27 MED ORDER — METRONIDAZOLE 500 MG PO TABS: 500.0000 mg | ORAL_TABLET | Freq: Two times a day (BID) | ORAL | 0 refills | Status: DC

## 2017-05-27 NOTE — Patient Instructions (Signed)

## 2017-05-27 NOTE — Progress Notes (Signed)
41 year old SBF G0, with complaint of vaginal discharge with odor, denies itching. New partner. Monthly cycle/condoms. Mild urinary frequency without nocturia. Denies abdominal pain, dysuria, or fever. History of HSV-2 with rare outbreaks.  Exam: Appears well. Abdomen soft nontender, external genitalia within normal limits, speculum exam moderate amount of a white adherent discharge with odor noted, wet prep positive for clues, TNTC bacteria. GC/Chlamydia culture taken. Bimanual no CMT or adnexal tenderness.  Bacteria vaginosis STD screen  Plan: Flagyl 500 twice daily for 7 days, prescription, proper use given and reviewed alcohol precautions. Instructed to call if no relief of discharge. GC/Chlamydia culture pending, HIV, hep B, C, RPR. Continue prenatal vitamins daily, contemplating pregnancy in the next year.

## 2017-05-28 LAB — HEPATITIS C ANTIBODY
Hepatitis C Ab: NONREACTIVE
SIGNAL TO CUT-OFF: 0.02 (ref <1.00)

## 2017-05-28 LAB — C. TRACHOMATIS/N. GONORRHOEAE RNA
C. TRACHOMATIS RNA, TMA: NOT DETECTED
N. GONORRHOEAE RNA, TMA: NOT DETECTED

## 2017-05-28 LAB — HEPATITIS B SURFACE ANTIGEN: Hepatitis B Surface Ag: NONREACTIVE

## 2017-05-28 LAB — HIV ANTIBODY (ROUTINE TESTING W REFLEX): HIV 1&2 Ab, 4th Generation: NONREACTIVE

## 2017-05-28 LAB — RPR: RPR Ser Ql: NONREACTIVE

## 2017-09-16 ENCOUNTER — Ambulatory Visit: Payer: PRIVATE HEALTH INSURANCE | Admitting: Women's Health

## 2017-09-16 ENCOUNTER — Encounter: Payer: Self-pay | Admitting: Women's Health

## 2017-09-16 VITALS — BP 118/78 | Ht 60.0 in | Wt 129.0 lb

## 2017-09-16 DIAGNOSIS — N898 Other specified noninflammatory disorders of vagina: Secondary | ICD-10-CM

## 2017-09-16 DIAGNOSIS — N760 Acute vaginitis: Secondary | ICD-10-CM

## 2017-09-16 DIAGNOSIS — B9689 Other specified bacterial agents as the cause of diseases classified elsewhere: Secondary | ICD-10-CM | POA: Diagnosis not present

## 2017-09-16 LAB — WET PREP FOR TRICH, YEAST, CLUE

## 2017-09-16 MED ORDER — METRONIDAZOLE 500 MG PO TABS: 500.0000 mg | ORAL_TABLET | Freq: Two times a day (BID) | ORAL | 0 refills | Status: DC

## 2017-09-16 NOTE — Patient Instructions (Signed)
Bacterial Vaginosis Bacterial vaginosis is a vaginal infection that occurs when the normal balance of bacteria in the vagina is disrupted. It results from an overgrowth of certain bacteria. This is the most common vaginal infection among women ages 15-44. Because bacterial vaginosis increases your risk for STIs (sexually transmitted infections), getting treated can help reduce your risk for chlamydia, gonorrhea, herpes, and HIV (human immunodeficiency virus). Treatment is also important for preventing complications in pregnant women, because this condition can cause an early (premature) delivery. What are the causes? This condition is caused by an increase in harmful bacteria that are normally present in small amounts in the vagina. However, the reason that the condition develops is not fully understood. What increases the risk? The following factors may make you more likely to develop this condition:  Having a new sexual partner or multiple sexual partners.  Having unprotected sex.  Douching.  Having an intrauterine device (IUD).  Smoking.  Drug and alcohol abuse.  Taking certain antibiotic medicines.  Being pregnant.  You cannot get bacterial vaginosis from toilet seats, bedding, swimming pools, or contact with objects around you. What are the signs or symptoms? Symptoms of this condition include:  Grey or white vaginal discharge. The discharge can also be watery or foamy.  A fish-like odor with discharge, especially after sexual intercourse or during menstruation.  Itching in and around the vagina.  Burning or pain with urination.  Some women with bacterial vaginosis have no signs or symptoms. How is this diagnosed? This condition is diagnosed based on:  Your medical history.  A physical exam of the vagina.  Testing a sample of vaginal fluid under a microscope to look for a large amount of bad bacteria or abnormal cells. Your health care provider may use a cotton swab  or a small wooden spatula to collect the sample.  How is this treated? This condition is treated with antibiotics. These may be given as a pill, a vaginal cream, or a medicine that is put into the vagina (suppository). If the condition comes back after treatment, a second round of antibiotics may be needed. Follow these instructions at home: Medicines  Take over-the-counter and prescription medicines only as told by your health care provider.  Take or use your antibiotic as told by your health care provider. Do not stop taking or using the antibiotic even if you start to feel better. General instructions  If you have a female sexual partner, tell her that you have a vaginal infection. She should see her health care provider and be treated if she has symptoms. If you have a female sexual partner, he does not need treatment.  During treatment: ? Avoid sexual activity until you finish treatment. ? Do not douche. ? Avoid alcohol as directed by your health care provider. ? Avoid breastfeeding as directed by your health care provider.  Drink enough water and fluids to keep your urine clear or pale yellow.  Keep the area around your vagina and rectum clean. ? Wash the area daily with warm water. ? Wipe yourself from front to back after using the toilet.  Keep all follow-up visits as told by your health care provider. This is important. How is this prevented?  Do not douche.  Wash the outside of your vagina with warm water only.  Use protection when having sex. This includes latex condoms and dental dams.  Limit how many sexual partners you have. To help prevent bacterial vaginosis, it is best to have sex with just   one partner (monogamous).  Make sure you and your sexual partner are tested for STIs.  Wear cotton or cotton-lined underwear.  Avoid wearing tight pants and pantyhose, especially during summer.  Limit the amount of alcohol that you drink.  Do not use any products that  contain nicotine or tobacco, such as cigarettes and e-cigarettes. If you need help quitting, ask your health care provider.  Do not use illegal drugs. Where to find more information:  Centers for Disease Control and Prevention: www.cdc.gov/std  American Sexual Health Association (ASHA): www.ashastd.org  [place] Department of Health and Human Services, Office on Women's Health: www.womenshealth.gov/ or https://www.womenshealth.gov/a-z-topics/bacterial-vaginosis Contact a health care provider if:  Your symptoms do not improve, even after treatment.  You have more discharge or pain when urinating.  You have a fever.  You have pain in your abdomen.  You have pain during sex.  You have vaginal bleeding between periods. Summary  Bacterial vaginosis is a vaginal infection that occurs when the normal balance of bacteria in the vagina is disrupted.  Because bacterial vaginosis increases your risk for STIs (sexually transmitted infections), getting treated can help reduce your risk for chlamydia, gonorrhea, herpes, and HIV (human immunodeficiency virus). Treatment is also important for preventing complications in pregnant women, because the condition can cause an early (premature) delivery.  This condition is treated with antibiotic medicines. These may be given as a pill, a vaginal cream, or a medicine that is put into the vagina (suppository). This information is not intended to replace advice given to you by your health care provider. Make sure you discuss any questions you have with your health care provider. Document Released: 08/11/2005 Document Revised: 12/15/2016 Document Reviewed: 04/26/2016 Elsevier Interactive Patient Education  2018 [employer]  

## 2017-09-16 NOTE — Progress Notes (Signed)
42 yo G0 SBF presents with vaginal odor. Denies discharge, itching, urinary frequency, dysuria or fever. Monthly cycle, lasting 7 days. Sexually active, using condoms . Same partner, negative STD screen, denies need for STD screen. History of HSV-2 with rare outbreaks. No known health problems.  Exam: Appears well. External genitalia within normal limits. Speculum exam vaginal walls erythematous, moderate discharge, wet prep positive for clue cells, TNTC bacteria. Bimanual no CMT or adnexal tenderness.  Bacterial vaginosis  Plan: Flagyl 500 mg BID x 7 days, proper use instructions given and reviewed alcohol precaution. Instructed to call if symptoms do not resolve. Contemplating pregnancy, urged to speak with partner regarding plans for future pregnancy. Continue multivitamin daily.

## 2018-03-30 ENCOUNTER — Ambulatory Visit: Payer: PRIVATE HEALTH INSURANCE | Admitting: Podiatry

## 2018-03-30 ENCOUNTER — Encounter: Payer: Self-pay | Admitting: Podiatry

## 2018-03-30 ENCOUNTER — Encounter

## 2018-03-30 DIAGNOSIS — L603 Nail dystrophy: Secondary | ICD-10-CM

## 2018-03-31 NOTE — Progress Notes (Signed)
She presents today to bring in a nail clipping of her onychomycosis last time she was in we could not trim the nails because it was so short so at this point she brought a piece of nail and that broke off.  Objective: Vital signs are stable she is alert and oriented x3 nice large piece of toenail that has broken off appears to be clinically mycotic.  Assessment: Onychomycosis nail dystrophy.  Plan: Sent the sample to the lab for evaluation.  We will notify her as to the results.

## 2018-04-06 ENCOUNTER — Telehealth: Payer: Self-pay

## 2018-04-06 NOTE — Telephone Encounter (Signed)
Left voicemail for patient to return my call.  Call was regarding results: Positive for fungus and Pseudomonas.

## 2018-04-06 NOTE — Telephone Encounter (Signed)
-----   Message from Garrel Ridgel, [place] sent at 04/06/2018  8:34 AM EDT ----- Positive for yeast and pseudomonas.

## 2018-04-07 ENCOUNTER — Telehealth: Payer: Self-pay | Admitting: Podiatry

## 2018-04-07 NOTE — Telephone Encounter (Signed)
I was returning a phone call to [place], [place]. My phone number is [phone]. Thank you.

## 2018-04-07 NOTE — Telephone Encounter (Signed)
LVM for patient to return my call or schedule an appt with Dr Milinda Pointer

## 2018-04-29 ENCOUNTER — Ambulatory Visit: Payer: PRIVATE HEALTH INSURANCE | Admitting: Podiatry

## 2018-04-30 ENCOUNTER — Ambulatory Visit (HOSPITAL_COMMUNITY)
Admission: EM | Admit: 2018-04-30 | Discharge: 2018-04-30 | Disposition: A | Discharge status: Home / Self Care | Payer: PRIVATE HEALTH INSURANCE | Attending: Family Medicine | Admitting: Family Medicine

## 2018-04-30 ENCOUNTER — Encounter (HOSPITAL_COMMUNITY): Payer: Self-pay | Admitting: Emergency Medicine

## 2018-04-30 DIAGNOSIS — M542 Cervicalgia: Secondary | ICD-10-CM | POA: Diagnosis not present

## 2018-04-30 NOTE — Discharge Instructions (Signed)
Use anti-inflammatories for pain/swelling. You may take up to 800 mg Ibuprofen every 8 hours with food. You may supplement Ibuprofen with Tylenol [phone] mg every 8 hours.   Please follow up if symptoms worsening, developing changes in vision, worsening headache, numbness and tingling into arms, loss of bowel/ bladder control, low back pain with numbness into legs, chest pain, shortness of breath, difficulty breathing, nausea, vomiting or persistent symptoms without improvement in 1-2 weeks.
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
## 2018-05-06 ENCOUNTER — Ambulatory Visit: Payer: PRIVATE HEALTH INSURANCE | Admitting: Podiatry

## 2018-05-06 ENCOUNTER — Encounter: Payer: Self-pay | Admitting: Podiatry

## 2018-05-06 DIAGNOSIS — Z79899 Other long term (current) drug therapy: Secondary | ICD-10-CM

## 2018-05-06 DIAGNOSIS — L603 Nail dystrophy: Secondary | ICD-10-CM | POA: Diagnosis not present

## 2018-05-06 MED ORDER — FLUCONAZOLE 150 MG PO TABS: 300.0000 mg | ORAL_TABLET | ORAL | 0 refills | Status: DC

## 2018-05-06 NOTE — Progress Notes (Signed)
She presents today for follow-up of her pathology report.  Objective: Pathology report does demonstrate a yeast infection to the toenail plate.  Assessment: Yeast infection nail plate.  Plan: Discussed etiology pathology conservative surgical therapies at this point I was going to start her on fluconazole 2 tablets once a week for 1 year with intermittent liver profile checks.  She did demonstrate interest in the laser therapy but he cannot decide what she wants to do.  I went ahead and printed a LFT paperwork and sent over her first 3 months of fluconazole.  Should she decide not to take it then she will follow-up with Janett Billow for laser therapy.

## 2018-05-24 ENCOUNTER — Telehealth: Payer: Self-pay | Admitting: Podiatry

## 2018-05-24 NOTE — Telephone Encounter (Signed)
Patient needs to have her Medical Records printed out for  10/1, she will pick them up.

## 2018-08-05 ENCOUNTER — Ambulatory Visit: Payer: PRIVATE HEALTH INSURANCE | Admitting: Podiatry

## 2018-11-29 ENCOUNTER — Other Ambulatory Visit: Payer: Self-pay

## 2018-11-30 ENCOUNTER — Encounter: Payer: Self-pay | Admitting: Women's Health

## 2018-11-30 ENCOUNTER — Other Ambulatory Visit: Payer: Self-pay

## 2018-11-30 ENCOUNTER — Ambulatory Visit: Payer: PRIVATE HEALTH INSURANCE | Admitting: Women's Health

## 2018-11-30 VITALS — BP 112/70

## 2018-11-30 DIAGNOSIS — N898 Other specified noninflammatory disorders of vagina: Secondary | ICD-10-CM | POA: Diagnosis not present

## 2018-11-30 LAB — WET PREP FOR TRICH, YEAST, CLUE

## 2018-11-30 MED ORDER — FLUCONAZOLE 150 MG PO TABS: 150.0000 mg | ORAL_TABLET | Freq: Once | ORAL | 0 refills | Status: AC

## 2018-11-30 NOTE — Progress Notes (Signed)
43 year old SBF G0 presents with complaint of increased vaginal discharge with mild irritation to left perineum near  rectum for the past few days.  States area always flat with no blister like appearance.  Mild vaginal itching with no odor.  Denies urinary symptoms, back/abdominal pain, or fever.  Monthly cycle, not sexually active greater than 2 years.  History of HSV per blood work with no outbreaks.  Overdue for annual.  Exam: Appears well.  External genitalia with small superficial irritated area left perineum, speculum exam moderate amount of a white discharge noted, wet prep negative.  Bimanual no CMT or adnexal tenderness.  Vaginal irritation  Plan: Reviewed normality of wet prep, reviewed may be yeast, Diflucan 1501 dose.  Instructed to call if continued increased discharge with irritation.  Reviewed superficial area may possibly be HSV, offered Valtrex declines will watch at this time.  Schedule annual exam.

## 2018-12-20 ENCOUNTER — Encounter: Payer: PRIVATE HEALTH INSURANCE | Admitting: Women's Health

## 2018-12-28 ENCOUNTER — Other Ambulatory Visit: Payer: Self-pay

## 2018-12-29 ENCOUNTER — Encounter: Payer: Self-pay | Admitting: Women's Health

## 2018-12-29 ENCOUNTER — Ambulatory Visit: Payer: PRIVATE HEALTH INSURANCE | Admitting: Women's Health

## 2018-12-29 VITALS — BP 120/80 | Ht 60.0 in | Wt 127.0 lb

## 2018-12-29 DIAGNOSIS — Z01419 Encounter for gynecological examination (general) (routine) without abnormal findings: Secondary | ICD-10-CM | POA: Diagnosis not present

## 2018-12-29 DIAGNOSIS — Z113 Encounter for screening for infections with a predominantly sexual mode of transmission: Secondary | ICD-10-CM | POA: Diagnosis not present

## 2018-12-29 DIAGNOSIS — B009 Herpesviral infection, unspecified: Secondary | ICD-10-CM

## 2018-12-29 DIAGNOSIS — Z13220 Encounter for screening for lipoid disorders: Secondary | ICD-10-CM | POA: Diagnosis not present

## 2018-12-29 NOTE — Patient Instructions (Signed)

## 2018-12-29 NOTE — Progress Notes (Signed)
Beth Sandoval 1976/08/15 [id number]    History:    Presents for annual exam.  Regular monthly cycle/not sexually active.  Normal Pap and mammogram history history of fibrocystic breasts.  HSV-2 no outbreaks.  Currently on Lamisil per  Podiatrist.  Past medical history, past surgical history, family history and social history were all reviewed and documented in the EPIC chart.  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  Was exercising 5 days weekly with a trainer plans to go back.  ROS:  A ROS was performed and pertinent positives and negatives are included.  Exam:  Vitals:   12/29/18 0943  BP: 120/80  Weight: 127 lb (57.6 kg)  Height: 5' (1.524 m)   Body mass index is 24.8 kg/m.   General appearance:  Normal Thyroid:  Symmetrical, normal in size, without palpable masses or nodularity. Respiratory  Auscultation:  Clear without wheezing or rhonchi Cardiovascular  Auscultation:  Regular rate, without rubs, murmurs or gallops  Edema/varicosities:  Not grossly evident Abdominal  Soft,nontender, without masses, guarding or rebound.  Liver/spleen:  No organomegaly noted  Hernia:  None appreciated  Skin  Inspection:  Grossly normal   Breasts: Examined lying and sitting.     Right: Without masses, retractions, discharge or axillary adenopathy.     Left: Without masses, retractions, discharge or axillary adenopathy. Gentitourinary   Inguinal/mons:  Normal without inguinal adenopathy  External genitalia:  Normal  BUS/Urethra/Skene's glands:  Normal  Vagina:  Normal  Cervix:  Normal  Uterus:   normal in size, shape and contour.  Midline and mobile  Adnexa/parametria:     Rt: Without masses or tenderness.   Lt: Without masses or tenderness.  Anus and perineum: Normal  Digital rectal exam: Normal sphincter tone without palpated masses or tenderness  Assessment/Plan:  43 y.o. SBF G0 for annual exam with no complaints.  Monthly cycle/not sexually active HSV no  outbreaks STD screen per request  Plan: Contraception options reviewed, will use condoms when sexually active.  SBEs, continue annual screening mammogram due, instructed to schedule.  Aware of importance of regular exercise, calcium rich foods, vitamin D 1000 daily encouraged.  CBC, CMP, lipid panel, GC/chlamydia, HIV,  RPR.  Pap normal 2018, new screening guidelines reviewed.   Huel Cote [facility], 9:51 AM 12/29/2018

## 2018-12-30 LAB — CBC WITH DIFFERENTIAL/PLATELET
Absolute Monocytes: 292 cells/uL (ref 200–950)
Basophils Absolute: 40 cells/uL (ref 0–200)
Basophils Relative: 1.1 %
Eosinophils Absolute: 11 cells/uL — ABNORMAL LOW (ref 15–500)
Eosinophils Relative: 0.3 %
HCT: 39.5 % (ref 35.0–45.0)
Hemoglobin: 12.7 g/dL (ref 11.7–15.5)
Lymphs Abs: 1112 cells/uL (ref 850–3900)
MCH: 28.1 pg (ref 27.0–33.0)
MCHC: 32.2 g/dL (ref 32.0–36.0)
MCV: 87.4 fL (ref 80.0–100.0)
MPV: 9.5 fL (ref 7.5–12.5)
Monocytes Relative: 8.1 %
Neutro Abs: 2146 cells/uL (ref 1500–7800)
Neutrophils Relative %: 59.6 %
Platelets: 381 10*3/uL (ref 140–400)
RBC: 4.52 10*6/uL (ref 3.80–5.10)
RDW: 12.4 % (ref 11.0–15.0)
Total Lymphocyte: 30.9 %
WBC: 3.6 10*3/uL — ABNORMAL LOW (ref 3.8–10.8)

## 2018-12-30 LAB — COMPREHENSIVE METABOLIC PANEL
AG Ratio: 1.3 (calc) (ref 1.0–2.5)
ALT: 12 U/L (ref 6–29)
AST: 17 U/L (ref 10–30)
Albumin: 4.3 g/dL (ref 3.6–5.1)
Alkaline phosphatase (APISO): 55 U/L (ref 31–125)
BUN: 13 mg/dL (ref 7–25)
CO2: 30 mmol/L (ref 20–32)
Calcium: 9.7 mg/dL (ref 8.6–10.2)
Chloride: 103 mmol/L (ref 98–110)
Creat: 0.91 mg/dL (ref 0.50–1.10)
Globulin: 3.2 g/dL (calc) (ref 1.9–3.7)
Glucose, Bld: 77 mg/dL (ref 65–99)
Potassium: 4.6 mmol/L (ref 3.5–5.3)
Sodium: 139 mmol/L (ref 135–146)
Total Bilirubin: 0.4 mg/dL (ref 0.2–1.2)
Total Protein: 7.5 g/dL (ref 6.1–8.1)

## 2018-12-30 LAB — LIPID PANEL
Cholesterol: 250 mg/dL — ABNORMAL HIGH (ref <200)
HDL: 58 mg/dL (ref >=50)
LDL Cholesterol (Calc): 175 mg/dL — ABNORMAL HIGH
Non-HDL Cholesterol (Calc): 192 mg/dL — ABNORMAL HIGH (ref <130)
Total CHOL/HDL Ratio: 4.3 (calc) (ref <5.0)
Triglycerides: 65 mg/dL (ref <150)

## 2018-12-30 LAB — C. TRACHOMATIS/N. GONORRHOEAE RNA
C. trachomatis RNA, TMA: NOT DETECTED
N. gonorrhoeae RNA, TMA: NOT DETECTED

## 2018-12-30 LAB — HIV ANTIBODY (ROUTINE TESTING W REFLEX): HIV 1&2 Ab, 4th Generation: NONREACTIVE

## 2018-12-30 LAB — RPR: RPR Ser Ql: NONREACTIVE

## 2018-12-31 LAB — URINALYSIS, COMPLETE W/RFL CULTURE
Bacteria, UA: NONE SEEN /HPF
Bilirubin Urine: NEGATIVE
Glucose, UA: NEGATIVE
Hgb urine dipstick: NEGATIVE
Hyaline Cast: NONE SEEN /LPF
Ketones, ur: NEGATIVE
Leukocyte Esterase: NEGATIVE
Nitrites, Initial: NEGATIVE
Protein, ur: NEGATIVE
Specific Gravity, Urine: 1.022 (ref 1.001–1.03)
pH: 8 (ref 5.0–8.0)

## 2018-12-31 LAB — URINE CULTURE
MICRO NUMBER:: 453627
Result:: NO GROWTH
SPECIMEN QUALITY:: ADEQUATE

## 2018-12-31 LAB — CULTURE INDICATED

## 2019-09-05 ENCOUNTER — Other Ambulatory Visit: Payer: Self-pay

## 2019-09-06 ENCOUNTER — Ambulatory Visit: Payer: PRIVATE HEALTH INSURANCE | Admitting: Women's Health

## 2019-09-06 ENCOUNTER — Encounter: Payer: Self-pay | Admitting: Women's Health

## 2019-09-06 VITALS — BP 118/76

## 2019-09-06 DIAGNOSIS — Z113 Encounter for screening for infections with a predominantly sexual mode of transmission: Secondary | ICD-10-CM | POA: Diagnosis not present

## 2019-09-06 NOTE — Progress Notes (Signed)
44 year old SBF G0 presents with cycle 1 week late, worried about pregnancy or a problem.  New partner in the last 4 months.  Pregnancy okay, states cycle started after arriving for office visit.  Denies vaginal discharge, itching, odor, abdominal/back pain, urinary symptoms or fever.  No known health problems.  Monthly cycle/condoms.  Exam: Appears well.  Abdomen soft, nontender, external genitalia within normal limits, speculum exam copious menses noted, GC/chlamydia culture taken.  Bimanual no CMT or adnexal tenderness.  STD screen  Plan: Keep menstrual calendar, prenatal vitamin daily, is hoping to conceive and reviewed importance of making a decision due to decreasing fertility rate.  HIV, RPR, GC/chlamydia culture pending.  Reassurance given.  Declines contraception will continue with condoms.

## 2019-09-07 LAB — RPR: RPR Ser Ql: NONREACTIVE

## 2019-09-07 LAB — HIV ANTIBODY (ROUTINE TESTING W REFLEX): HIV 1&2 Ab, 4th Generation: NONREACTIVE

## 2019-09-07 LAB — GC PROBE AMP THINPREP: N. gonorrhoeae RNA, TMA: NOT DETECTED

## 2019-09-07 LAB — CHLAMYDIA PROBE AMP THINPREP: C. trachomatis RNA, TMA: NOT DETECTED

## 2020-01-02 ENCOUNTER — Encounter: Payer: PRIVATE HEALTH INSURANCE | Admitting: Nurse Practitioner

## 2020-01-09 ENCOUNTER — Encounter: Payer: Self-pay | Admitting: Nurse Practitioner

## 2020-01-09 ENCOUNTER — Ambulatory Visit (INDEPENDENT_AMBULATORY_CARE_PROVIDER_SITE_OTHER): Payer: PRIVATE HEALTH INSURANCE | Admitting: Nurse Practitioner

## 2020-01-09 ENCOUNTER — Telehealth: Payer: Self-pay | Admitting: *Deleted

## 2020-01-09 ENCOUNTER — Other Ambulatory Visit: Payer: Self-pay

## 2020-01-09 VITALS — BP 118/80 | Ht 60.0 in | Wt 136.0 lb

## 2020-01-09 DIAGNOSIS — Z01419 Encounter for gynecological examination (general) (routine) without abnormal findings: Secondary | ICD-10-CM | POA: Diagnosis not present

## 2020-01-09 DIAGNOSIS — N898 Other specified noninflammatory disorders of vagina: Secondary | ICD-10-CM

## 2020-01-09 DIAGNOSIS — E559 Vitamin D deficiency, unspecified: Secondary | ICD-10-CM

## 2020-01-09 DIAGNOSIS — L309 Dermatitis, unspecified: Secondary | ICD-10-CM | POA: Diagnosis not present

## 2020-01-09 DIAGNOSIS — Z13220 Encounter for screening for lipoid disorders: Secondary | ICD-10-CM

## 2020-01-09 LAB — WET PREP FOR TRICH, YEAST, CLUE

## 2020-01-09 MED ORDER — HYDROCORTISONE 1 % EX OINT: 1.0000 "application " | TOPICAL_OINTMENT | Freq: Two times a day (BID) | CUTANEOUS | 0 refills | Status: DC

## 2020-01-09 NOTE — Telephone Encounter (Signed)
-----   Message from Tamela Gammon, NP sent at 01/09/2020  9:18 AM EDT ----- Please send referral to fertility clinic due to age and history of infertility. Thank you

## 2020-01-09 NOTE — Progress Notes (Signed)
   Beth Sandoval 1976-08-15 [id number]   History:  44 y.o. SBF G0 presents for annual exam. Complains of vaginal irritation without discharge or odor, denies urinary symptoms.  Says this occurs most months after her cycles, thinks she may be allergic to pads.  Normal mammogram and Pap history.  Did have a benign cyst in 2018 on her right breast, declined aspiration.  Sexually active, in a stable relationship.  Interested in conceiving, would like a referral to fertility clinic.  Regular monthly cycle with ovulation symptoms.  Gynecologic History Patient's last menstrual period was 12/31/2019. Period Cycle (Days): 28 Period Duration (Days): 5 Period Pattern: Regular Menstrual Flow: Moderate Menstrual Control: Maxi pad, Tampon Dysmenorrhea: (!) Mild Dysmenorrhea Symptoms: Cramping Contraception: condoms Last Pap: 01/12/2017. Results were: normal Last mammogram: 01/05/2017. Results were:Right benign breast cyst, negative bilaterally for suspicion of malignancy  Past medical history, past surgical history, family history and social history were all reviewed and documented in the EPIC chart.  Works for [employer].  Has began running again in the past month.  ROS:  A ROS was performed and pertinent positives and negatives are included.  Exam:  Vitals:   01/09/20 0848  BP: 118/80  Weight: 136 lb (61.7 kg)  Height: 5' (1.524 m)   Body mass index is 26.56 kg/m.  General appearance:  Normal Thyroid:  Symmetrical, normal in size, without palpable masses or nodularity. Respiratory  Auscultation:  Clear without wheezing or rhonchi Cardiovascular  Auscultation:  Regular rate, without rubs, murmurs or gallops  Edema/varicosities:  Not grossly evident Abdominal  Soft,mild TTP of lower abdomen, without masses, guarding or rebound.  Liver/spleen:  No organomegaly noted  Hernia:  None appreciated  Skin  Inspection:  Grossly normal   Breasts: Examined lying and  sitting.   Right: Without masses, retractions, discharge or axillary adenopathy.   Left: Without masses, retractions, discharge or axillary adenopathy. Gentitourinary   Inguinal/mons:  Normal without inguinal adenopathy  External genitalia:  Normal  BUS/Urethra/Skene's glands:  Normal  Vagina:  Normal  Cervix:  Normal  Uterus:  Normal in size, shape and contour.  Midline and mobile  Adnexa/parametria:     Rt: Without masses or tenderness.   Lt: Without masses or tenderness.  Anus and perineum: Normal  Digital rectal exam: Normal sphincter tone without palpated masses or tenderness  Assessment/Plan:  44 y.o.SBF G0  for annual exam.   Well female exam with routine gynecological exam - Plan: CBC with Differential/Platelet, Comprehensive metabolic panel. Education provided on SBEs, importance of preventative screenings, current guidelines, high calcium diet, regular exercise, and multivitamin daily. Plans to schedule mammogram in July, just had her second Covid vaccine 2 weeks ago.  Will place referral to fertility clinic due to age.  Lipid screening - Plan: Lipid panel  Dermatitis - Plan: hydrocortisone 1 % ointment apply thin layer externally to vagina twice a day during menses and a few days after if irritation occurs.  Vitamin D deficiency - Plan: VITAMIN D 25 Hydroxy (Vit-D Deficiency, Fractures)  Follow-up in 1 year for annual    [place], 9:03 AM 01/09/2020

## 2020-01-09 NOTE — Telephone Encounter (Signed)
Office notes faxed to [facility] they will call and schedule.

## 2020-01-09 NOTE — Patient Instructions (Signed)
Health Maintenance, Female Adopting a healthy lifestyle and getting preventive care are important in promoting health and wellness. Ask your health care provider about:  The right schedule for you to have regular tests and exams.  Things you can do on your own to prevent diseases and keep yourself healthy. What should I know about diet, weight, and exercise? Eat a healthy diet   Eat a diet that includes plenty of vegetables, fruits, low-fat dairy products, and lean protein.  Do not eat a lot of foods that are high in solid fats, added sugars, or sodium. Maintain a healthy weight Body mass index (BMI) is used to identify weight problems. It estimates body fat based on height and weight. Your health care provider can help determine your BMI and help you achieve or maintain a healthy weight. Get regular exercise Get regular exercise. This is one of the most important things you can do for your health. Most adults should:  Exercise for at least 150 minutes each week. The exercise should increase your heart rate and make you sweat (moderate-intensity exercise).  Do strengthening exercises at least twice a week. This is in addition to the moderate-intensity exercise.  Spend less time sitting. Even light physical activity can be beneficial. Watch cholesterol and blood lipids Have your blood tested for lipids and cholesterol at 45 years of age, then have this test every 5 years. Have your cholesterol levels checked more often if:  Your lipid or cholesterol levels are high.  You are older than 45 years of age.  You are at high risk for heart disease. What should I know about cancer screening? Depending on your health history and family history, you may need to have cancer screening at various ages. This may include screening for:  Breast cancer.  Cervical cancer.  Colorectal cancer.  Skin cancer.  Lung cancer. What should I know about heart disease, diabetes, and high blood  pressure? Blood pressure and heart disease  High blood pressure causes heart disease and increases the risk of stroke. This is more likely to develop in people who have high blood pressure readings, are of African descent, or are overweight.  Have your blood pressure checked: ? Every 3-5 years if you are 18-39 years of age. ? Every year if you are 40 years old or older. Diabetes Have regular diabetes screenings. This checks your fasting blood sugar level. Have the screening done:  Once every three years after age 40 if you are at a normal weight and have a low risk for diabetes.  More often and at a younger age if you are overweight or have a high risk for diabetes. What should I know about preventing infection? Hepatitis B If you have a higher risk for hepatitis B, you should be screened for this virus. Talk with your health care provider to find out if you are at risk for hepatitis B infection. Hepatitis C Testing is recommended for:  Everyone born from 1945 through 1965.  Anyone with known risk factors for hepatitis C. Sexually transmitted infections (STIs)  Get screened for STIs, including gonorrhea and chlamydia, if: ? You are sexually active and are younger than 45 years of age. ? You are older than 45 years of age and your health care provider tells you that you are at risk for this type of infection. ? Your sexual activity has changed since you were last screened, and you are at increased risk for chlamydia or gonorrhea. Ask your health care provider if   you are at risk.  Ask your health care provider about whether you are at high risk for HIV. Your health care provider may recommend a prescription medicine to help prevent HIV infection. If you choose to take medicine to prevent HIV, you should first get tested for HIV. You should then be tested every 3 months for as long as you are taking the medicine. Pregnancy  If you are about to stop having your period (premenopausal) and  you may become pregnant, seek counseling before you get pregnant.  Take 400 to 800 micrograms (mcg) of folic acid every day if you become pregnant.  Ask for birth control (contraception) if you want to prevent pregnancy. Osteoporosis and menopause Osteoporosis is a disease in which the bones lose minerals and strength with aging. This can result in bone fractures. If you are 65 years old or older, or if you are at risk for osteoporosis and fractures, ask your health care provider if you should:  Be screened for bone loss.  Take a calcium or vitamin D supplement to lower your risk of fractures.  Be given hormone replacement therapy (HRT) to treat symptoms of menopause. Follow these instructions at home: Lifestyle  Do not use any products that contain nicotine or tobacco, such as cigarettes, e-cigarettes, and chewing tobacco. If you need help quitting, ask your health care provider.  Do not use street drugs.  Do not share needles.  Ask your health care provider for help if you need support or information about quitting drugs. Alcohol use  Do not drink alcohol if: ? Your health care provider tells you not to drink. ? You are pregnant, may be pregnant, or are planning to become pregnant.  If you drink alcohol: ? Limit how much you use to 0-1 drink a day. ? Limit intake if you are breastfeeding.  Be aware of how much alcohol is in your drink. In the [place] one drink equals one 12 oz bottle of beer (355 mL), one 5 oz glass of wine (148 mL), or one 1 oz glass of hard liquor (44 mL). General instructions  Schedule regular health, dental, and eye exams.  Stay current with your vaccines.  Tell your health care provider if: ? You often feel depressed. ? You have ever been abused or do not feel safe at home. Summary  Adopting a healthy lifestyle and getting preventive care are important in promoting health and wellness.  Follow your health care provider's instructions about healthy  diet, exercising, and getting tested or screened for diseases.  Follow your health care provider's instructions on monitoring your cholesterol and blood pressure. This information is not intended to replace advice given to you by your health care provider. Make sure you discuss any questions you have with your health care provider. Document Revised: 08/04/2018 Document Reviewed: 08/04/2018 Elsevier Patient Education  2020 [employer] Inc[employer]  

## 2020-01-09 NOTE — Addendum Note (Signed)
Addended by: Lorine Bears on: 01/09/2020 09:22 AM   Modules accepted: Orders

## 2020-01-10 LAB — VITAMIN D 25 HYDROXY (VIT D DEFICIENCY, FRACTURES): Vit D, 25-Hydroxy: 45 ng/mL (ref 30–100)

## 2020-01-10 LAB — CBC WITH DIFFERENTIAL/PLATELET
Absolute Monocytes: 291 cells/uL (ref 200–950)
Basophils Absolute: 19 cells/uL (ref 0–200)
Basophils Relative: 0.6 %
Eosinophils Absolute: 19 cells/uL (ref 15–500)
Eosinophils Relative: 0.6 %
HCT: 40.3 % (ref 35.0–45.0)
Hemoglobin: 12.7 g/dL (ref 11.7–15.5)
Lymphs Abs: 1261 cells/uL (ref 850–3900)
MCH: 28.2 pg (ref 27.0–33.0)
MCHC: 31.5 g/dL — ABNORMAL LOW (ref 32.0–36.0)
MCV: 89.6 fL (ref 80.0–100.0)
MPV: 9.5 fL (ref 7.5–12.5)
Monocytes Relative: 9.1 %
Neutro Abs: 1610 cells/uL (ref 1500–7800)
Neutrophils Relative %: 50.3 %
Platelets: 396 10*3/uL (ref 140–400)
RBC: 4.5 10*6/uL (ref 3.80–5.10)
RDW: 12.3 % (ref 11.0–15.0)
Total Lymphocyte: 39.4 %
WBC: 3.2 10*3/uL — ABNORMAL LOW (ref 3.8–10.8)

## 2020-01-10 LAB — COMPREHENSIVE METABOLIC PANEL
AG Ratio: 1.3 (calc) (ref 1.0–2.5)
ALT: 13 U/L (ref 6–29)
AST: 18 U/L (ref 10–30)
Albumin: 3.9 g/dL (ref 3.6–5.1)
Alkaline phosphatase (APISO): 55 U/L (ref 31–125)
BUN: 13 mg/dL (ref 7–25)
CO2: 27 mmol/L (ref 20–32)
Calcium: 9.1 mg/dL (ref 8.6–10.2)
Chloride: 103 mmol/L (ref 98–110)
Creat: 0.9 mg/dL (ref 0.50–1.10)
Globulin: 3 g/dL (calc) (ref 1.9–3.7)
Glucose, Bld: 81 mg/dL (ref 65–99)
Potassium: 4.5 mmol/L (ref 3.5–5.3)
Sodium: 137 mmol/L (ref 135–146)
Total Bilirubin: 0.3 mg/dL (ref 0.2–1.2)
Total Protein: 6.9 g/dL (ref 6.1–8.1)

## 2020-01-10 LAB — LIPID PANEL
Cholesterol: 216 mg/dL — ABNORMAL HIGH (ref <200)
HDL: 54 mg/dL (ref >=50)
LDL Cholesterol (Calc): 146 mg/dL (calc) — ABNORMAL HIGH
Non-HDL Cholesterol (Calc): 162 mg/dL (calc) — ABNORMAL HIGH (ref <130)
Total CHOL/HDL Ratio: 4 (calc) (ref <5.0)
Triglycerides: 64 mg/dL (ref <150)

## 2020-01-11 ENCOUNTER — Encounter: Payer: PRIVATE HEALTH INSURANCE | Admitting: Nurse Practitioner

## 2020-01-16 NOTE — Telephone Encounter (Signed)
Patient scheduled on 04/17/20 with Dr.Kincaid

## 2020-02-03 ENCOUNTER — Other Ambulatory Visit: Payer: Self-pay

## 2020-02-06 ENCOUNTER — Ambulatory Visit (INDEPENDENT_AMBULATORY_CARE_PROVIDER_SITE_OTHER): Payer: PRIVATE HEALTH INSURANCE | Admitting: Nurse Practitioner

## 2020-02-06 ENCOUNTER — Other Ambulatory Visit: Payer: Self-pay

## 2020-02-06 ENCOUNTER — Encounter: Payer: Self-pay | Admitting: Nurse Practitioner

## 2020-02-06 VITALS — BP 122/80

## 2020-02-06 DIAGNOSIS — N9411 Superficial (introital) dyspareunia: Secondary | ICD-10-CM

## 2020-02-06 NOTE — Progress Notes (Signed)
   Acute Office Visit  Subjective:    Patient ID: Beth Sandoval, female    DOB: 1976-06-18, 44 y.o.   MRN: [id number]  Chief Complaint  Patient presents with  . Vaginal Pain    ml  . pain during intercourse    HPI 44 year old presents today for pain with intercourse. The pain is at the vaginal opening, "like glass", and stops after completion of intercourse Complains of dryness and pain with insertion.The pain is not new but has worsened over the last few months. Denies vaginal itching, odor, discharge, or bleeding. Sexually active occasionally with boyfriend who lives out of town. Does not use lubrication.   Review of Systems  Gastrointestinal: Negative.   Genitourinary: Positive for dyspareunia. Negative for dysuria, frequency, menstrual problem, urgency, vaginal bleeding and vaginal discharge.       Objective:    Physical Exam Constitutional:      Appearance: Normal appearance.  Abdominal:     General: Abdomen is flat. There is no distension.     Palpations: Abdomen is soft.     Tenderness: There is no abdominal tenderness.  Genitourinary:    General: Normal vulva.     Labia:        Right: No rash, tenderness, lesion or injury.        Left: No rash, tenderness, lesion or injury.      Vagina: Normal. No signs of injury. No vaginal discharge, erythema, tenderness, bleeding or lesions.     Cervix: Normal.     Uterus: Normal.      BP 122/80 (BP Location: Right Arm, Patient Position: Sitting, Cuff Size: Normal)   LMP 01/24/2020  Wt Readings from Last 3 Encounters:  01/09/20 136 lb (61.7 kg)  12/29/18 127 lb (57.6 kg)  09/16/17 129 lb (58.5 kg)        Assessment & Plan:   Problem List Items Addressed This Visit    None    Visit Diagnoses    Introital dyspareunia    -  Primary     Plan: Pain most likely from infrequent intercourse and vaginal dryness. Discussed lubrication options to include over the counter products and coconut oil, as well as vaginal  dilators. Reassurance provided on normal exam and being able to have enjoyable intercourse again.      Tamela Gammon [facility], 8:33 PM 02/06/2020

## 2020-03-12 ENCOUNTER — Other Ambulatory Visit: Payer: Self-pay | Admitting: Nurse Practitioner

## 2020-03-12 DIAGNOSIS — B3731 Acute candidiasis of vulva and vagina: Secondary | ICD-10-CM

## 2020-03-12 MED ORDER — FLUCONAZOLE 150 MG PO TABS: 150.0000 mg | ORAL_TABLET | ORAL | 0 refills | Status: DC

## 2020-05-21 ENCOUNTER — Ambulatory Visit: Payer: PRIVATE HEALTH INSURANCE | Admitting: Nurse Practitioner

## 2020-05-21 ENCOUNTER — Encounter: Payer: Self-pay | Admitting: Nurse Practitioner

## 2020-05-21 ENCOUNTER — Other Ambulatory Visit: Payer: Self-pay

## 2020-05-21 DIAGNOSIS — N6324 Unspecified lump in the left breast, lower inner quadrant: Secondary | ICD-10-CM

## 2020-05-21 DIAGNOSIS — N6001 Solitary cyst of right breast: Secondary | ICD-10-CM | POA: Insufficient documentation

## 2020-05-21 NOTE — Progress Notes (Signed)
° °  Acute Office Visit  Subjective:    Patient ID: Beth Sandoval, female    DOB: 1976/01/23, 44 y.o.   MRN: [id number]   HPI 44 y.o. presents today for left breast lump that she felt 1 week ago. History of right breast cyst with aspiration. Last mammogram in 2018 showed benign right cyst.    Review of Systems  Constitutional: Negative.   Genitourinary:       Left breast lump, tender to palpation, no nipple discharge, swelling, or redness  Hematological: Negative for adenopathy.       Objective:    Physical Exam Constitutional:      Appearance: Normal appearance.  Chest:     Breasts:        Right: Normal.        Left: Mass and tenderness present. No swelling, inverted nipple, nipple discharge or skin change.    Lymphadenopathy:     Upper Body:     Left upper body: No axillary adenopathy.     LMP 04/23/2020 (Approximate)  Wt Readings from Last 3 Encounters:  01/09/20 136 lb (61.7 kg)  12/29/18 127 lb (57.6 kg)  09/16/17 129 lb (58.5 kg)        Assessment & Plan:   Problem List Items Addressed This Visit    None    Visit Diagnoses    Breast lump on left side at 8 o'clock position    -  Primary     Plan: Referral sent for diagnostic mammogram for further evaluation. She is agreeable to plan.      Jay, 8:14 PM 05/21/2020

## 2020-05-22 ENCOUNTER — Telehealth: Payer: Self-pay | Admitting: *Deleted

## 2020-05-22 DIAGNOSIS — N6324 Unspecified lump in the left breast, lower inner quadrant: Secondary | ICD-10-CM

## 2020-05-22 NOTE — Telephone Encounter (Signed)
Patient scheduled on 06/05/20 @ 2:45pm at the breast center ,patient informed.

## 2020-05-22 NOTE — Telephone Encounter (Signed)
-----   Message from Tamela Gammon, NP sent at 05/21/2020  3:55 PM EDT ----- Please send referral for diagnostic mammogram of left breast for mass. She is also due for screening mammogram. Thank you

## 2020-06-05 ENCOUNTER — Ambulatory Visit
Admission: RE | Admit: 2020-06-05 | Discharge: 2020-06-05 | Disposition: A | Discharge status: Home / Self Care | Payer: PRIVATE HEALTH INSURANCE | Source: Ambulatory Visit | Attending: Nurse Practitioner | Admitting: Nurse Practitioner

## 2020-06-05 ENCOUNTER — Other Ambulatory Visit: Payer: Self-pay

## 2020-06-05 DIAGNOSIS — N6324 Unspecified lump in the left breast, lower inner quadrant: Secondary | ICD-10-CM

## 2020-08-10 ENCOUNTER — Ambulatory Visit: Payer: PRIVATE HEALTH INSURANCE | Admitting: Obstetrics & Gynecology

## 2020-08-10 ENCOUNTER — Encounter: Payer: Self-pay | Admitting: Obstetrics & Gynecology

## 2020-08-10 ENCOUNTER — Other Ambulatory Visit: Payer: Self-pay

## 2020-08-10 ENCOUNTER — Other Ambulatory Visit: Payer: Self-pay | Admitting: Anesthesiology

## 2020-08-10 VITALS — BP 126/82

## 2020-08-10 DIAGNOSIS — N83201 Unspecified ovarian cyst, right side: Secondary | ICD-10-CM

## 2020-08-10 DIAGNOSIS — N898 Other specified noninflammatory disorders of vagina: Secondary | ICD-10-CM | POA: Diagnosis not present

## 2020-08-10 DIAGNOSIS — Z113 Encounter for screening for infections with a predominantly sexual mode of transmission: Secondary | ICD-10-CM

## 2020-08-10 LAB — WET PREP FOR TRICH, YEAST, CLUE

## 2020-08-10 MED ORDER — TINIDAZOLE 500 MG PO TABS: 1000.0000 mg | ORAL_TABLET | Freq: Every day | ORAL | 1 refills | Status: AC

## 2020-08-10 NOTE — Progress Notes (Signed)
    Beth Sandoval 27-Jun-1976 [id number]        44 y.o.  G0  RP: Vaginal discharge and abdominal cramping  HPI: Vaginal discharge and lower abdominal cramping.   OB History  Gravida Para Term Preterm AB Living  0 0 0 0 0 0  SAB IAB Ectopic Multiple Live Births  0 0 0 0 0    Past medical history,surgical history, problem list, medications, allergies, family history and social history were all reviewed and documented in the EPIC chart.   Directed ROS with pertinent positives and negatives documented in the history of present illness/assessment and plan.  Exam:  Vitals:   08/10/20 1228  BP: 126/82   General appearance:  Normal  Abdomen: Normal  Gynecologic exam: Vulva normal.  Speculum:  Cervix/Vagina normal.  Increased vaginal d/c.  Gono-chlam done.  Wet prep done.  Bimanual exam:  Uterus AV, normal volume, NT.  Mild increase in Rt adnexal volume, cyst?  Wet prep:  Clue cells present   Assessment/Plan:  44 y.o. G0  1. Vaginal discharge Bacterial Vaginosis confirmed by Wet prep.  Decision to treat with Tinidazole.  Usage reviewed.  Prescription sent to pharmacy. - WET PREP FOR [place], [place], CLUE  2. Screen for STD (sexually transmitted disease) Full STI screen done.  Strict condom use recommended. - HIV antibody (with reflex) - RPR - Hepatitis B Surface AntiGEN - Hepatitis C Antibody - C. trachomatis/N. gonorrhoeae RNA  3. Cyst of right ovary Possible Rt ovarian cyst on Gyn exam.  Will further investigate with a Pelvic US at f/u. - US Transvaginal Non-OB; Future  Other orders - tinidazole (TINDAMAX) 500 MG tablet; Take 2 tablets (1,000 mg total) by mouth daily for 2 days.  Princess Bruins MD, 12:42 PM 08/10/2020

## 2020-08-12 LAB — C. TRACHOMATIS/N. GONORRHOEAE RNA
C. trachomatis RNA, TMA: NOT DETECTED
N. gonorrhoeae RNA, TMA: NOT DETECTED

## 2020-08-20 ENCOUNTER — Encounter: Payer: Self-pay | Admitting: Obstetrics & Gynecology

## 2020-09-05 DIAGNOSIS — F4322 Adjustment disorder with anxiety: Secondary | ICD-10-CM | POA: Diagnosis not present

## 2020-09-13 DIAGNOSIS — F4322 Adjustment disorder with anxiety: Secondary | ICD-10-CM | POA: Diagnosis not present

## 2020-09-13 MED ORDER — TINIDAZOLE 500 MG PO TABS: ORAL_TABLET | ORAL | 1 refills | Status: DC

## 2020-09-22 DIAGNOSIS — F4322 Adjustment disorder with anxiety: Secondary | ICD-10-CM | POA: Diagnosis not present

## 2020-10-01 DIAGNOSIS — F4322 Adjustment disorder with anxiety: Secondary | ICD-10-CM | POA: Diagnosis not present

## 2020-10-10 DIAGNOSIS — F4322 Adjustment disorder with anxiety: Secondary | ICD-10-CM | POA: Diagnosis not present

## 2020-10-11 ENCOUNTER — Ambulatory Visit: Payer: BLUE CROSS/BLUE SHIELD | Admitting: Obstetrics & Gynecology

## 2020-10-11 ENCOUNTER — Ambulatory Visit (INDEPENDENT_AMBULATORY_CARE_PROVIDER_SITE_OTHER): Payer: BLUE CROSS/BLUE SHIELD

## 2020-10-11 ENCOUNTER — Other Ambulatory Visit: Payer: Self-pay

## 2020-10-11 DIAGNOSIS — Z113 Encounter for screening for infections with a predominantly sexual mode of transmission: Secondary | ICD-10-CM

## 2020-10-11 DIAGNOSIS — N83201 Unspecified ovarian cyst, right side: Secondary | ICD-10-CM

## 2020-10-11 DIAGNOSIS — D219 Benign neoplasm of connective and other soft tissue, unspecified: Secondary | ICD-10-CM

## 2020-10-11 DIAGNOSIS — E559 Vitamin D deficiency, unspecified: Secondary | ICD-10-CM | POA: Diagnosis not present

## 2020-10-11 NOTE — Progress Notes (Signed)
    Beth Sandoval Jan 09, 1976 [id number]        45 y.o.  G0  RP: Possible Rt adnexal mass on gyn exam for Pelvic US  HPI: No pelvic pain or menorrhagia.  Possible Rt adnexal mass on Gyn exam 08/10/2020.   OB History  Gravida Para Term Preterm AB Living  0 0 0 0 0 0  SAB IAB Ectopic Multiple Live Births  0 0 0 0 0    Past medical history,surgical history, problem list, medications, allergies, family history and social history were all reviewed and documented in the EPIC chart.   Directed ROS with pertinent positives and negatives documented in the history of present illness/assessment and plan.  Exam:  There were no vitals filed for this visit. General appearance:  Normal  Pelvic US today: T/V images.  Anteverted uterus enlarged with fibroids.  The overall size of the uterus is measured at 10.94 x 5.72 x 5.11 cm.  The largest fibroid is measured at 4.6 x 3.5 cm located fundally.  The endometrial lining is within normal for day 28 of the cycle, measured at 13.42 mm.  Cannot rule out encroachment of the largest fibroid at the endometrium.  Right ovary with a thick wall avascular with internal low-level echoes compatible with a hemorrhagic corpus luteum measured at 2.4 x 2.2 cm.  The left ovary appears negative.  No adnexal mass.  No free fluid in the posterior cul-de-sac.   Assessment/Plan:  45 y.o. G0P0000   1. Cyst of right ovary Pelvic ultrasound findings thoroughly reviewed with patient.  Small functional Rt ovarian cyst.  Patient reassured.  2. Fibroids No pelvic pain or menorrhagia associated with the uterine fibroids.  Decision to observe.  3. Screening examination for venereal disease Gono-Chlam Neg 07/2020.  Complete the STI screen today. - Hepatitis C Antibody - HIV antibody (with reflex) - RPR - Hepatitis B Surface AntiGEN   4. Vitamin D deficiency - Vitamin D 1,25 dihydroxy  Princess Bruins MD, 4:23 PM 10/11/2020

## 2020-10-15 LAB — HEPATITIS C ANTIBODY
Hepatitis C Ab: NONREACTIVE
SIGNAL TO CUT-OFF: 0.01 (ref <1.00)

## 2020-10-15 LAB — HIV ANTIBODY (ROUTINE TESTING W REFLEX): HIV 1&2 Ab, 4th Generation: NONREACTIVE

## 2020-10-15 LAB — RPR: RPR Ser Ql: NONREACTIVE

## 2020-10-15 LAB — HEPATITIS B SURFACE ANTIGEN: Hepatitis B Surface Ag: NONREACTIVE

## 2020-10-17 LAB — VITAMIN D 1,25 DIHYDROXY
Vitamin D 1, 25 (OH)2 Total: 32 pg/mL (ref 18–72)
Vitamin D2 1, 25 (OH)2: <8 pg/mL
Vitamin D3 1, 25 (OH)2: 32 pg/mL

## 2020-10-18 DIAGNOSIS — F4322 Adjustment disorder with anxiety: Secondary | ICD-10-CM | POA: Diagnosis not present

## 2020-10-20 ENCOUNTER — Encounter: Payer: Self-pay | Admitting: Obstetrics & Gynecology

## 2020-10-26 DIAGNOSIS — F4322 Adjustment disorder with anxiety: Secondary | ICD-10-CM | POA: Diagnosis not present

## 2020-11-05 DIAGNOSIS — F4322 Adjustment disorder with anxiety: Secondary | ICD-10-CM | POA: Diagnosis not present

## 2020-11-16 DIAGNOSIS — F4322 Adjustment disorder with anxiety: Secondary | ICD-10-CM | POA: Diagnosis not present

## 2020-11-26 DIAGNOSIS — F4322 Adjustment disorder with anxiety: Secondary | ICD-10-CM | POA: Diagnosis not present

## 2020-12-07 DIAGNOSIS — F4322 Adjustment disorder with anxiety: Secondary | ICD-10-CM | POA: Diagnosis not present

## 2020-12-25 DIAGNOSIS — F4322 Adjustment disorder with anxiety: Secondary | ICD-10-CM | POA: Diagnosis not present

## 2021-01-08 ENCOUNTER — Encounter: Payer: Self-pay | Admitting: Nurse Practitioner

## 2021-01-08 DIAGNOSIS — F4322 Adjustment disorder with anxiety: Secondary | ICD-10-CM | POA: Diagnosis not present

## 2021-01-09 ENCOUNTER — Encounter: Payer: Self-pay | Admitting: Nurse Practitioner

## 2021-01-09 ENCOUNTER — Other Ambulatory Visit: Payer: Self-pay

## 2021-01-09 ENCOUNTER — Ambulatory Visit (INDEPENDENT_AMBULATORY_CARE_PROVIDER_SITE_OTHER): Payer: BLUE CROSS/BLUE SHIELD | Admitting: Nurse Practitioner

## 2021-01-09 VITALS — BP 116/74 | Ht 60.0 in | Wt 139.0 lb

## 2021-01-09 DIAGNOSIS — N898 Other specified noninflammatory disorders of vagina: Secondary | ICD-10-CM

## 2021-01-09 DIAGNOSIS — Z01419 Encounter for gynecological examination (general) (routine) without abnormal findings: Secondary | ICD-10-CM

## 2021-01-09 LAB — WET PREP FOR TRICH, YEAST, CLUE

## 2021-01-09 NOTE — Patient Instructions (Signed)
Health Maintenance, Female Adopting a healthy lifestyle and getting preventive care are important in promoting health and wellness. Ask your health care provider about:  The right schedule for you to have regular tests and exams.  Things you can do on your own to prevent diseases and keep yourself healthy. What should I know about diet, weight, and exercise? Eat a healthy diet  Eat a diet that includes plenty of vegetables, fruits, low-fat dairy products, and lean protein.  Do not eat a lot of foods that are high in solid fats, added sugars, or sodium.   Maintain a healthy weight Body mass index (BMI) is used to identify weight problems. It estimates body fat based on height and weight. Your health care provider can help determine your BMI and help you achieve or maintain a healthy weight. Get regular exercise Get regular exercise. This is one of the most important things you can do for your health. Most adults should:  Exercise for at least 150 minutes each week. The exercise should increase your heart rate and make you sweat (moderate-intensity exercise).  Do strengthening exercises at least twice a week. This is in addition to the moderate-intensity exercise.  Spend less time sitting. Even light physical activity can be beneficial. Watch cholesterol and blood lipids Have your blood tested for lipids and cholesterol at 46 years of age, then have this test every 5 years. Have your cholesterol levels checked more often if:  Your lipid or cholesterol levels are high.  You are older than 46 years of age.  You are at high risk for heart disease. What should I know about cancer screening? Depending on your health history and family history, you may need to have cancer screening at various ages. This may include screening for:  Breast cancer.  Cervical cancer.  Colorectal cancer.  Skin cancer.  Lung cancer. What should I know about heart disease, diabetes, and high blood  pressure? Blood pressure and heart disease  High blood pressure causes heart disease and increases the risk of stroke. This is more likely to develop in people who have high blood pressure readings, are of African descent, or are overweight.  Have your blood pressure checked: ? Every 3-5 years if you are 18-39 years of age. ? Every year if you are 40 years old or older. Diabetes Have regular diabetes screenings. This checks your fasting blood sugar level. Have the screening done:  Once every three years after age 40 if you are at a normal weight and have a low risk for diabetes.  More often and at a younger age if you are overweight or have a high risk for diabetes. What should I know about preventing infection? Hepatitis B If you have a higher risk for hepatitis B, you should be screened for this virus. Talk with your health care provider to find out if you are at risk for hepatitis B infection. Hepatitis C Testing is recommended for:  Everyone born from 1945 through 1965.  Anyone with known risk factors for hepatitis C. Sexually transmitted infections (STIs)  Get screened for STIs, including gonorrhea and chlamydia, if: ? You are sexually active and are younger than 46 years of age. ? You are older than 46 years of age and your health care provider tells you that you are at risk for this type of infection. ? Your sexual activity has changed since you were last screened, and you are at increased risk for chlamydia or gonorrhea. Ask your health care provider   if you are at risk.  Ask your health care provider about whether you are at high risk for HIV. Your health care provider may recommend a prescription medicine to help prevent HIV infection. If you choose to take medicine to prevent HIV, you should first get tested for HIV. You should then be tested every 3 months for as long as you are taking the medicine. Pregnancy  If you are about to stop having your period (premenopausal) and  you may become pregnant, seek counseling before you get pregnant.  Take 400 to 800 micrograms (mcg) of folic acid every day if you become pregnant.  Ask for birth control (contraception) if you want to prevent pregnancy. Osteoporosis and menopause Osteoporosis is a disease in which the bones lose minerals and strength with aging. This can result in bone fractures. If you are 65 years old or older, or if you are at risk for osteoporosis and fractures, ask your health care provider if you should:  Be screened for bone loss.  Take a calcium or vitamin D supplement to lower your risk of fractures.  Be given hormone replacement therapy (HRT) to treat symptoms of menopause. Follow these instructions at home: Lifestyle  Do not use any products that contain nicotine or tobacco, such as cigarettes, e-cigarettes, and chewing tobacco. If you need help quitting, ask your health care provider.  Do not use street drugs.  Do not share needles.  Ask your health care provider for help if you need support or information about quitting drugs. Alcohol use  Do not drink alcohol if: ? Your health care provider tells you not to drink. ? You are pregnant, may be pregnant, or are planning to become pregnant.  If you drink alcohol: ? Limit how much you use to 0-1 drink a day. ? Limit intake if you are breastfeeding.  Be aware of how much alcohol is in your drink. In the [place] one drink equals one 12 oz bottle of beer (355 mL), one 5 oz glass of wine (148 mL), or one 1 oz glass of hard liquor (44 mL). General instructions  Schedule regular health, dental, and eye exams.  Stay current with your vaccines.  Tell your health care provider if: ? You often feel depressed. ? You have ever been abused or do not feel safe at home. Summary  Adopting a healthy lifestyle and getting preventive care are important in promoting health and wellness.  Follow your health care provider's instructions about healthy  diet, exercising, and getting tested or screened for diseases.  Follow your health care provider's instructions on monitoring your cholesterol and blood pressure. This information is not intended to replace advice given to you by your health care provider. Make sure you discuss any questions you have with your health care provider. Document Revised: 08/04/2018 Document Reviewed: 08/04/2018 Elsevier Patient Education  2021 Elsevier Inc.  

## 2021-01-09 NOTE — Progress Notes (Signed)
   LAMIRA BORIN August 09, 1976 [id number]   History:  45 y.o. G0 presents for annual exam. Monthly cycles, no contraception and okay with pregnancy. She saw fertility specialist last year and was told she would require egg donor, and she is not pursuing at this time. Normal pap and mammogram history. Ultrasound 09/2020 showed fibroids with largest measuring 4.6 x 3.5 cm, otherwise unremarkable with decision to observe as she is asymptomatic. History of benign breast cysts. Sexually active with long-term boyfriend. Complains of mild vaginal irritation.   Gynecologic History Patient's last menstrual period was 12/13/2020. Period Cycle (Days): 28 Period Duration (Days): 8 Period Pattern: Regular Menstrual Flow: Heavy Dysmenorrhea: (!) Mild Dysmenorrhea Symptoms: Cramping Contraception/Family planning: none  Health Maintenance Last Pap: 01/12/2017. Results were: normal Last mammogram: 06/05/2020. Results were: negative for malignancy, benign left cyst Last colonoscopy: Not indicated Last Dexa: Not indicated  Past medical history, past surgical history, family history and social history were all reviewed and documented in the EPIC chart. Boyfriend. Works for [employer].  ROS:  A ROS was performed and pertinent positives and negatives are included.  Exam:  Vitals:   01/09/21 0811  BP: 116/74  Weight: 139 lb (63 kg)  Height: 5' (1.524 m)   Body mass index is 27.15 kg/m.  General appearance:  Normal Thyroid:  Symmetrical, normal in size, without palpable masses or nodularity. Respiratory  Auscultation:  Clear without wheezing or rhonchi Cardiovascular  Auscultation:  Regular rate, without rubs, murmurs or gallops  Edema/varicosities:  Not grossly evident Abdominal  Soft,nontender, without masses, guarding or rebound.  Liver/spleen:  No organomegaly noted  Hernia:  None appreciated  Skin  Inspection:  Grossly normal Breasts: Examined lying and sitting. Fibrocystic breast  tissue bilaterally  Right: Without masses, retractions, nipple discharge or axillary adenopathy.   Left: Without masses, retractions, nipple discharge or axillary adenopathy. Genitourinary   Inguinal/mons:  Normal without inguinal adenopathy  External genitalia:  Normal appearing vulva with no masses, tenderness, or lesions  BUS/Urethra/Skene's glands:  Normal  Vagina:  Normal appearing with normal color and discharge, no lesions  Cervix:  Normal appearing without discharge or lesions  Uterus:  Normal in size, shape and contour.  Midline and mobile, nontender  Adnexa/parametria:     Rt: Normal in size, without masses or tenderness.   Lt: Normal in size, without masses or tenderness.  Anus and perineum: Normal  Assessment/Plan:  45 y.o. G0 for annual exam.   Well female exam with routine gynecological exam - Education provided on SBEs, importance of preventative screenings, current guidelines, high calcium diet, regular exercise, and multivitamin daily. Labs with PCP.  Screening for cervical cancer - Normal Pap history.  Will repeat at 5-year interval per guidelines.  Screening for breast cancer - Normal mammogram history.  Continue annual screenings.  Normal breast exam today.  Screening for colon cancer - scheduled for colonoscopy next April.  Return in 1 year for annual.    Tamela Gammon DNP, 8:21 AM 01/09/2021

## 2021-01-31 DIAGNOSIS — F4322 Adjustment disorder with anxiety: Secondary | ICD-10-CM | POA: Diagnosis not present

## 2021-02-04 DIAGNOSIS — Z20822 Contact with and (suspected) exposure to covid-19: Secondary | ICD-10-CM | POA: Diagnosis not present

## 2021-02-11 ENCOUNTER — Ambulatory Visit: Payer: BLUE CROSS/BLUE SHIELD | Admitting: Nurse Practitioner

## 2021-07-01 DIAGNOSIS — F4322 Adjustment disorder with anxiety: Secondary | ICD-10-CM | POA: Diagnosis not present

## 2021-07-22 ENCOUNTER — Other Ambulatory Visit: Payer: Self-pay

## 2021-07-22 ENCOUNTER — Telehealth: Payer: Self-pay

## 2021-07-22 DIAGNOSIS — Z1321 Encounter for screening for nutritional disorder: Secondary | ICD-10-CM

## 2021-07-22 NOTE — Telephone Encounter (Signed)
I received message from front desk that patient would like to have her Vitamin D level checked.  AEX 01/09/21 Last VIT D level 10/01/20 Vitamin D was 32.  Ok to place order?

## 2021-07-22 NOTE — Telephone Encounter (Signed)
Lab order placed.

## 2021-07-22 NOTE — Telephone Encounter (Signed)
Yes, thank you.

## 2021-07-25 ENCOUNTER — Other Ambulatory Visit: Payer: Self-pay

## 2021-07-25 ENCOUNTER — Other Ambulatory Visit: Payer: BC Managed Care – PPO

## 2021-07-25 DIAGNOSIS — Z1321 Encounter for screening for nutritional disorder: Secondary | ICD-10-CM

## 2021-07-26 LAB — VITAMIN D 25 HYDROXY (VIT D DEFICIENCY, FRACTURES): Vit D, 25-Hydroxy: 38 ng/mL (ref 30–100)

## 2021-07-30 DIAGNOSIS — F4322 Adjustment disorder with anxiety: Secondary | ICD-10-CM | POA: Diagnosis not present

## 2021-08-05 DIAGNOSIS — F4322 Adjustment disorder with anxiety: Secondary | ICD-10-CM | POA: Diagnosis not present

## 2021-08-14 DIAGNOSIS — F4322 Adjustment disorder with anxiety: Secondary | ICD-10-CM | POA: Diagnosis not present

## 2021-08-30 DIAGNOSIS — Z131 Encounter for screening for diabetes mellitus: Secondary | ICD-10-CM | POA: Diagnosis not present

## 2021-08-30 DIAGNOSIS — E538 Deficiency of other specified B group vitamins: Secondary | ICD-10-CM | POA: Diagnosis not present

## 2021-08-30 DIAGNOSIS — R5382 Chronic fatigue, unspecified: Secondary | ICD-10-CM | POA: Diagnosis not present

## 2021-08-30 DIAGNOSIS — R635 Abnormal weight gain: Secondary | ICD-10-CM | POA: Diagnosis not present

## 2021-08-30 DIAGNOSIS — R6889 Other general symptoms and signs: Secondary | ICD-10-CM | POA: Diagnosis not present

## 2021-08-30 DIAGNOSIS — Z77120 Contact with and (suspected) exposure to mold (toxic): Secondary | ICD-10-CM | POA: Diagnosis not present

## 2021-08-30 DIAGNOSIS — L659 Nonscarring hair loss, unspecified: Secondary | ICD-10-CM | POA: Diagnosis not present

## 2021-08-30 DIAGNOSIS — F4322 Adjustment disorder with anxiety: Secondary | ICD-10-CM | POA: Diagnosis not present

## 2021-08-30 DIAGNOSIS — E559 Vitamin D deficiency, unspecified: Secondary | ICD-10-CM | POA: Diagnosis not present

## 2021-09-11 DIAGNOSIS — F4322 Adjustment disorder with anxiety: Secondary | ICD-10-CM | POA: Diagnosis not present

## 2021-09-21 DIAGNOSIS — F4322 Adjustment disorder with anxiety: Secondary | ICD-10-CM | POA: Diagnosis not present

## 2021-10-01 DIAGNOSIS — R5382 Chronic fatigue, unspecified: Secondary | ICD-10-CM | POA: Diagnosis not present

## 2021-10-01 DIAGNOSIS — R635 Abnormal weight gain: Secondary | ICD-10-CM | POA: Diagnosis not present

## 2021-10-01 DIAGNOSIS — E038 Other specified hypothyroidism: Secondary | ICD-10-CM | POA: Diagnosis not present

## 2021-10-01 DIAGNOSIS — L659 Nonscarring hair loss, unspecified: Secondary | ICD-10-CM | POA: Diagnosis not present

## 2021-10-05 DIAGNOSIS — F4322 Adjustment disorder with anxiety: Secondary | ICD-10-CM | POA: Diagnosis not present

## 2021-10-14 ENCOUNTER — Encounter: Payer: Self-pay | Admitting: Nurse Practitioner

## 2021-10-14 ENCOUNTER — Other Ambulatory Visit: Payer: Self-pay

## 2021-10-14 ENCOUNTER — Ambulatory Visit: Payer: BC Managed Care – PPO | Admitting: Nurse Practitioner

## 2021-10-14 VITALS — BP 116/74

## 2021-10-14 DIAGNOSIS — N6323 Unspecified lump in the left breast, lower outer quadrant: Secondary | ICD-10-CM | POA: Diagnosis not present

## 2021-10-14 DIAGNOSIS — N898 Other specified noninflammatory disorders of vagina: Secondary | ICD-10-CM | POA: Diagnosis not present

## 2021-10-14 LAB — WET PREP FOR TRICH, YEAST, CLUE

## 2021-10-14 NOTE — Progress Notes (Signed)
° °  Acute Office Visit  Subjective:    Patient ID: Beth Sandoval, female    DOB: 1975/11/10, 46 y.o.   MRN: [id number]   HPI 46 y.o. presents today for left breast lump that she noticed last night. Area is painful to touch and sharp pains radiate into axillary region. Diagnostic mammogram 05/2020 showed benign left breast cyst. She has had cysts aspirated in both breasts, most recently in 2018. She also complains of vaginal discharge without itching or odor.    Review of Systems  Constitutional: Negative.   Genitourinary:  Positive for vaginal discharge.  Left breast: Positive for mass. Negative for nipple discharge, redness, swelling, or skin changes Right breast:  Negative for mass, nipple discharge, redness, swelling, or skin changes    Objective:    Physical Exam Constitutional:      Appearance: Normal appearance.  Chest:  Breasts:    Right: Normal.     Left: Mass and tenderness present. No swelling, bleeding, inverted nipple, nipple discharge or skin change.    Genitourinary:    General: Normal vulva.     Vagina: Normal.     Cervix: Normal.    BP 116/74    LMP 09/30/2021  Wt Readings from Last 3 Encounters:  01/09/21 139 lb (63 kg)  01/09/20 136 lb (61.7 kg)  12/29/18 127 lb (57.6 kg)   Wet prep negative     Patient informed chaperone available to be present for breast and pelvic exam. Patient has requested no chaperone to be present. Patient has been advised what will be completed during breast and pelvic exam.   Assessment & Plan:   Problem List Items Addressed This Visit   None Visit Diagnoses     Mass of lower outer quadrant of left breast    -  Primary   Vaginal discharge       Relevant Orders   WET PREP FOR [place], [place], CLUE      Plan: Will send referral for diagnostic mammogram. Negative wet prep and exam.      Tamela Gammon DNP, 4:54 PM 10/14/2021

## 2021-10-15 ENCOUNTER — Telehealth: Payer: Self-pay | Admitting: *Deleted

## 2021-10-15 DIAGNOSIS — N6323 Unspecified lump in the left breast, lower outer quadrant: Secondary | ICD-10-CM

## 2021-10-15 NOTE — Telephone Encounter (Signed)
Orders placed at the breast center, patient scheduled on 11/06/21 @ 2:20pm. Arrival time at 2:00pm.

## 2021-10-15 NOTE — Telephone Encounter (Signed)
-----   Message from Tamela Gammon, NP sent at 10/14/2021  4:54 PM EST ----- Regarding: Diagnostic mammogram Please send referral for left breast ultrasound for mass. She is also due for screening mammogram. Thank you.

## 2021-10-22 NOTE — Telephone Encounter (Signed)
Patient was informed by [facility]. Patient called the breast center and now scheduled on 11/01/21.

## 2021-10-29 DIAGNOSIS — F4322 Adjustment disorder with anxiety: Secondary | ICD-10-CM | POA: Diagnosis not present

## 2021-11-01 ENCOUNTER — Ambulatory Visit
Admission: RE | Admit: 2021-11-01 | Discharge: 2021-11-01 | Disposition: A | Discharge status: Home / Self Care | Payer: BC Managed Care – PPO | Source: Ambulatory Visit | Attending: Nurse Practitioner | Admitting: Nurse Practitioner

## 2021-11-01 ENCOUNTER — Other Ambulatory Visit: Payer: Self-pay | Admitting: Nurse Practitioner

## 2021-11-01 ENCOUNTER — Ambulatory Visit
Admission: RE | Admit: 2021-11-01 | Discharge: 2021-11-01 | Disposition: A | Discharge status: Home / Self Care | Payer: PRIVATE HEALTH INSURANCE | Source: Ambulatory Visit | Attending: Nurse Practitioner | Admitting: Nurse Practitioner

## 2021-11-01 DIAGNOSIS — N6310 Unspecified lump in the right breast, unspecified quadrant: Secondary | ICD-10-CM

## 2021-11-01 DIAGNOSIS — R922 Inconclusive mammogram: Secondary | ICD-10-CM | POA: Diagnosis not present

## 2021-11-01 DIAGNOSIS — N6323 Unspecified lump in the left breast, lower outer quadrant: Secondary | ICD-10-CM

## 2021-11-06 ENCOUNTER — Other Ambulatory Visit: Payer: PRIVATE HEALTH INSURANCE

## 2021-11-12 ENCOUNTER — Ambulatory Visit
Admission: RE | Admit: 2021-11-12 | Discharge: 2021-11-12 | Disposition: A | Discharge status: Home / Self Care | Payer: BC Managed Care – PPO | Source: Ambulatory Visit | Attending: Nurse Practitioner | Admitting: Nurse Practitioner

## 2021-11-12 ENCOUNTER — Other Ambulatory Visit: Payer: Self-pay | Admitting: Nurse Practitioner

## 2021-11-12 ENCOUNTER — Other Ambulatory Visit: Payer: Self-pay | Admitting: General Practice

## 2021-11-12 DIAGNOSIS — N6310 Unspecified lump in the right breast, unspecified quadrant: Secondary | ICD-10-CM

## 2021-11-12 DIAGNOSIS — N6311 Unspecified lump in the right breast, upper outer quadrant: Secondary | ICD-10-CM | POA: Diagnosis not present

## 2021-11-12 DIAGNOSIS — N6011 Diffuse cystic mastopathy of right breast: Secondary | ICD-10-CM | POA: Diagnosis not present

## 2021-11-18 DIAGNOSIS — F4322 Adjustment disorder with anxiety: Secondary | ICD-10-CM | POA: Diagnosis not present

## 2021-11-27 DIAGNOSIS — F4322 Adjustment disorder with anxiety: Secondary | ICD-10-CM | POA: Diagnosis not present

## 2021-12-02 DIAGNOSIS — F4322 Adjustment disorder with anxiety: Secondary | ICD-10-CM | POA: Diagnosis not present

## 2021-12-12 DIAGNOSIS — L659 Nonscarring hair loss, unspecified: Secondary | ICD-10-CM | POA: Diagnosis not present

## 2021-12-12 DIAGNOSIS — R5382 Chronic fatigue, unspecified: Secondary | ICD-10-CM | POA: Diagnosis not present

## 2021-12-12 DIAGNOSIS — R635 Abnormal weight gain: Secondary | ICD-10-CM | POA: Diagnosis not present

## 2021-12-12 DIAGNOSIS — E038 Other specified hypothyroidism: Secondary | ICD-10-CM | POA: Diagnosis not present

## 2021-12-13 DIAGNOSIS — F4322 Adjustment disorder with anxiety: Secondary | ICD-10-CM | POA: Diagnosis not present

## 2021-12-20 ENCOUNTER — Ambulatory Visit: Payer: BC Managed Care – PPO | Admitting: Radiology

## 2021-12-20 VITALS — BP 116/78

## 2021-12-20 DIAGNOSIS — N909 Noninflammatory disorder of vulva and perineum, unspecified: Secondary | ICD-10-CM

## 2021-12-20 MED ORDER — VALACYCLOVIR HCL 1 G PO TABS: 1000.0000 mg | ORAL_TABLET | Freq: Two times a day (BID) | ORAL | 0 refills | Status: DC

## 2021-12-20 NOTE — Progress Notes (Signed)
? ? ? ? ?  Subjective: Beth Sandoval is a 46 y.o. 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 She is sexually active with 1 female partner, using condoms. ? ? ?Review of Systems - negative except noted in HPI ? ?Objective: ? ?Physical Exam ?Constitutional:   ?   Appearance: Normal appearance.  ?Cardiovascular:  ?   Rate and Rhythm: Normal rate.  ?Pulmonary:  ?   Effort: Pulmonary effort is normal.  ?Abdominal:  ?   General: Abdomen is flat.  ?   Palpations: Abdomen is soft.  ?Skin: ?   General: Skin is warm and dry.  ?Neurological:  ?   Mental Status: She is alert.  ?Psychiatric:     ?   Mood and Affect: Mood normal.     ?   Thought Content: Thought content normal.     ?   Judgment: Judgment normal.  ? Vulva: cluster of vesicular lesions present on left side perineum, swabbed for HSV ?Vagina: no discharge or lesions present ? ? ? ? ?Chaperone offered and declined. ? ?Assessment:/Plan:  ? ?1. 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  ? ?Will contact patient with results of testing completed today. Avoid intercourse until symptoms are resolved. Safe sex encouraged. Avoid the use of soaps or perfumed products in the peri area.   ?

## 2021-12-24 DIAGNOSIS — F4322 Adjustment disorder with anxiety: Secondary | ICD-10-CM | POA: Diagnosis not present

## 2021-12-25 ENCOUNTER — Telehealth: Payer: Self-pay

## 2021-12-25 DIAGNOSIS — B009 Herpesviral infection, unspecified: Secondary | ICD-10-CM

## 2021-12-25 LAB — SURESWAB HSV, TYPE 1/2 DNA, PCR
HSV 1 DNA: NOT DETECTED
HSV 2 DNA: DETECTED — AB

## 2021-12-25 MED ORDER — VALACYCLOVIR HCL 500 MG PO TABS: 500.0000 mg | ORAL_TABLET | Freq: Every day | ORAL | 1 refills | Status: AC

## 2021-12-25 NOTE — Telephone Encounter (Signed)
Finish the current rx then may start Valtrex '500mg'$  po daily #90 with 1 refill. Thanks!

## 2021-12-25 NOTE — Telephone Encounter (Signed)
error 

## 2021-12-25 NOTE — Telephone Encounter (Signed)
HSV results came in. Pt notified of results and recommendation from provider. Pt reports that she is currently on tail end of flare up, things seem to be healing nicely but pt is requesting Rx for daily suppressant if possible and would also like to inquire if you would recommend her to take the prescription already Rxd first and then start with suppression Rx after or what your throughts are on that? Please advise.  ?

## 2021-12-25 NOTE — Telephone Encounter (Signed)
Left detailed msg per DPR with instructions. Rx sent in.  ?

## 2021-12-25 NOTE — Telephone Encounter (Signed)
Patient called because she was told her test results would be in on Monday or Tuesday and she does not see them in My Chart yet. ? ?I checked with our lab who checked on results and replied "HSV went to [facility] for testing (there is a delay in the TAT of testing). Customer service is giving me an est. completion date of this Friday 05/05." ? ?I called patient back and per DPR access note on file I left her a detailed message with that info and told her we will call her back as soon as received. ?

## 2021-12-26 DIAGNOSIS — L659 Nonscarring hair loss, unspecified: Secondary | ICD-10-CM | POA: Diagnosis not present

## 2021-12-26 DIAGNOSIS — R5382 Chronic fatigue, unspecified: Secondary | ICD-10-CM | POA: Diagnosis not present

## 2021-12-26 DIAGNOSIS — E038 Other specified hypothyroidism: Secondary | ICD-10-CM | POA: Diagnosis not present

## 2021-12-26 DIAGNOSIS — R7989 Other specified abnormal findings of blood chemistry: Secondary | ICD-10-CM | POA: Diagnosis not present

## 2022-01-04 DIAGNOSIS — F4322 Adjustment disorder with anxiety: Secondary | ICD-10-CM | POA: Diagnosis not present

## 2022-01-13 NOTE — Progress Notes (Unsigned)
   Beth Sandoval [birth year] [id number]   History:  46 y.o. G0 presents for annual exam. Monthly cycles, no contraception and okay with pregnancy. She saw fertility specialist a couple of years ago and was told she would require egg donor, and she is not pursuing at this time. Normal pap and mammogram history. Ultrasound 09/2020 showed fibroids with largest measuring 4.6 x 3.5 cm, otherwise unremarkable with decision to observe as she is asymptomatic. Recent HSV outbreak, initial.  Gynecologic History No LMP recorded.   Contraception/Family planning: none Sexually active: ***  Health Maintenance Last Pap: 01/12/2017. Results were: Normal Last mammogram: 11/01/2021. Results were: Right breast mass - follow up 11/12/2021 FIBROSIS WITH ACUTE AND CHRONIC INFLAMMATION AND HEMORRHAGE, WITH FEATURES CHARACTERISTIC FOR CYST WALL TISSUE with 6 month follow up recommended Last colonoscopy: Never Last Dexa: Not indicated  Past medical history, past surgical history, family history and social history were all reviewed and documented in the EPIC chart. Boyfriend. Works for [employer].  ROS:  A ROS was performed and pertinent positives and negatives are included.  Exam:  There were no vitals filed for this visit.  There is no height or weight on file to calculate BMI.  General appearance:  Normal Thyroid:  Symmetrical, normal in size, without palpable masses or nodularity. Respiratory  Auscultation:  Clear without wheezing or rhonchi Cardiovascular  Auscultation:  Regular rate, without rubs, murmurs or gallops  Edema/varicosities:  Not grossly evident Abdominal  Soft,nontender, without masses, guarding or rebound.  Liver/spleen:  No organomegaly noted  Hernia:  None appreciated  Skin  Inspection:  Grossly normal Breasts: Examined lying and sitting. Fibrocystic breast tissue bilaterally  Right: Without masses, retractions, nipple discharge or axillary adenopathy.   Left: Without masses,  retractions, nipple discharge or axillary adenopathy. Genitourinary   Inguinal/mons:  Normal without inguinal adenopathy  External genitalia:  Normal appearing vulva with no masses, tenderness, or lesions  BUS/Urethra/Skene's glands:  Normal  Vagina:  Normal appearing with normal color and discharge, no lesions  Cervix:  Normal appearing without discharge or lesions  Uterus:  Normal in size, shape and contour.  Midline and mobile, nontender  Adnexa/parametria:     Rt: Normal in size, without masses or tenderness.   Lt: Normal in size, without masses or tenderness.  Anus and perineum: Normal  Digital rectal exam: Normal sphincter tone without palpated masses or tenderness  Patient informed chaperone available to be present for breast and pelvic exam. Patient has requested no chaperone to be present. Patient has been advised what will be completed during breast and pelvic exam.   Assessment/Plan:  46 y.o. G0 for annual exam.   Well female exam with routine gynecological exam - Education provided on SBEs, importance of preventative screenings, current guidelines, high calcium diet, regular exercise, and multivitamin daily. Labs with PCP.  Screening for cervical cancer - Normal Pap history.  Will repeat at 5-year interval per guidelines.  Screening for breast cancer - Normal mammogram history.  Continue annual screenings.  Normal breast exam today.  Screening for colon cancer - scheduled for colonoscopy next April.  Return in 1 year for annual.    Tamela Gammon DNP, 9:38 AM 01/13/2022

## 2022-01-14 ENCOUNTER — Encounter: Payer: Self-pay | Admitting: Nurse Practitioner

## 2022-01-14 ENCOUNTER — Ambulatory Visit (INDEPENDENT_AMBULATORY_CARE_PROVIDER_SITE_OTHER): Payer: BC Managed Care – PPO | Admitting: Nurse Practitioner

## 2022-01-14 ENCOUNTER — Other Ambulatory Visit (HOSPITAL_COMMUNITY)
Admission: RE | Admit: 2022-01-14 | Discharge: 2022-01-14 | Disposition: A | Discharge status: Home / Self Care | Payer: BC Managed Care – PPO | Source: Ambulatory Visit | Attending: Nurse Practitioner | Admitting: Nurse Practitioner

## 2022-01-14 VITALS — BP 120/78 | Ht 61.0 in | Wt 149.0 lb

## 2022-01-14 DIAGNOSIS — Z113 Encounter for screening for infections with a predominantly sexual mode of transmission: Secondary | ICD-10-CM | POA: Insufficient documentation

## 2022-01-14 DIAGNOSIS — Z01419 Encounter for gynecological examination (general) (routine) without abnormal findings: Secondary | ICD-10-CM | POA: Insufficient documentation

## 2022-01-14 DIAGNOSIS — B009 Herpesviral infection, unspecified: Secondary | ICD-10-CM | POA: Diagnosis not present

## 2022-01-14 NOTE — Patient Instructions (Signed)
Schedule Colonoscopy! ?[facility] ?[phone] ?[address] [place], [place] [address] ? ?

## 2022-01-15 ENCOUNTER — Encounter: Payer: Self-pay | Admitting: Nurse Practitioner

## 2022-01-15 LAB — CYTOLOGY - PAP
Adequacy: ABSENT
Chlamydia: NEGATIVE
Comment: NEGATIVE
Comment: NEGATIVE
Comment: NEGATIVE
Comment: NORMAL
Diagnosis: NEGATIVE
High risk HPV: NEGATIVE
Neisseria Gonorrhea: NEGATIVE
Trichomonas: NEGATIVE

## 2022-01-15 LAB — RPR: RPR Ser Ql: NONREACTIVE

## 2022-01-15 LAB — HIV ANTIBODY (ROUTINE TESTING W REFLEX): HIV 1&2 Ab, 4th Generation: NONREACTIVE

## 2022-01-18 DIAGNOSIS — F4322 Adjustment disorder with anxiety: Secondary | ICD-10-CM | POA: Diagnosis not present

## 2022-02-01 DIAGNOSIS — F4322 Adjustment disorder with anxiety: Secondary | ICD-10-CM | POA: Diagnosis not present

## 2022-02-05 DIAGNOSIS — F4322 Adjustment disorder with anxiety: Secondary | ICD-10-CM | POA: Diagnosis not present

## 2022-02-17 DIAGNOSIS — F4322 Adjustment disorder with anxiety: Secondary | ICD-10-CM | POA: Diagnosis not present

## 2022-02-26 DIAGNOSIS — F4322 Adjustment disorder with anxiety: Secondary | ICD-10-CM | POA: Diagnosis not present

## 2022-03-04 DIAGNOSIS — F4322 Adjustment disorder with anxiety: Secondary | ICD-10-CM | POA: Diagnosis not present

## 2022-03-21 DIAGNOSIS — F4322 Adjustment disorder with anxiety: Secondary | ICD-10-CM | POA: Diagnosis not present

## 2022-03-27 DIAGNOSIS — M79651 Pain in right thigh: Secondary | ICD-10-CM | POA: Diagnosis not present

## 2022-04-04 DIAGNOSIS — F4322 Adjustment disorder with anxiety: Secondary | ICD-10-CM | POA: Diagnosis not present

## 2022-04-26 DIAGNOSIS — F4322 Adjustment disorder with anxiety: Secondary | ICD-10-CM | POA: Diagnosis not present

## 2022-05-17 DIAGNOSIS — F4322 Adjustment disorder with anxiety: Secondary | ICD-10-CM | POA: Diagnosis not present

## 2022-06-03 ENCOUNTER — Ambulatory Visit: Payer: BC Managed Care – PPO | Admitting: Radiology

## 2022-06-04 DIAGNOSIS — J019 Acute sinusitis, unspecified: Secondary | ICD-10-CM | POA: Diagnosis not present

## 2022-07-01 ENCOUNTER — Telehealth: Payer: Self-pay | Admitting: *Deleted

## 2022-07-01 ENCOUNTER — Ambulatory Visit: Payer: BC Managed Care – PPO | Admitting: Radiology

## 2022-07-01 VITALS — BP 112/76

## 2022-07-01 DIAGNOSIS — N6311 Unspecified lump in the right breast, upper outer quadrant: Secondary | ICD-10-CM

## 2022-07-01 NOTE — Progress Notes (Signed)
   Beth Sandoval 31-Mar-1976 [id number]   History:  46 y.o. G0 presents with complaints of right breast mass x 1 month. Does have a hx of breast cysts that have needed aspiration in the past.  Gynecologic History Patient's last menstrual period was 06/11/2022.   Contraception/Family planning: condoms Last mammogram: 11/01/21. Results were: normal  Obstetric History OB History  Gravida Para Term Preterm AB Living  0 0 0 0 0 0  SAB IAB Ectopic Multiple Live Births  0 0 0 0 0     The following portions of the patient's history were reviewed and updated as appropriate: allergies, current medications, past family history, past medical history, past social history, past surgical history, and problem list.  Review of Systems Pertinent items noted in HPI and remainder of comprehensive ROS otherwise negative.   Past medical history, past surgical history, family history and social history were all reviewed and documented in the EPIC chart.   Exam:  Vitals:   07/01/22 1210  BP: 112/76   There is no height or weight on file to calculate BMI.  General appearance:  Normal Thyroid:  Symmetrical, normal in size, without palpable masses or nodularity. Respiratory  Auscultation:  Clear without wheezing or rhonchi Cardiovascular  Auscultation:  Regular rate, without rubs, murmurs or gallops  Edema/varicosities:  Not grossly evident Breasts: left breast normal without mass, skin or nipple changes or axillary nodes, abnormal mass palpable right breast 10clock, 3cm oblong shape, tender, mobile.   Patient informed chaperone available to be present for breast exam. Patient has requested no chaperone to be present.   Assessment/Plan:   1. Mass of upper outer quadrant of right breast Diagnostic mammogram and u/s ordered   Rubbie Battiest B WHNP-BC 12:20 PM 07/01/2022

## 2022-07-01 NOTE — Telephone Encounter (Signed)
-----   Message from Kerry Dory, NP sent at 07/01/2022 12:19 PM EST ----- Regarding: Diagnostic mammo Please schedule diagnostic mammo and u/s. Right breast mass 10 oclock, oblong 3cm.

## 2022-07-02 NOTE — Telephone Encounter (Addendum)
See my chart message from 07/01/22.

## 2022-07-11 NOTE — Telephone Encounter (Signed)
Left message for patient to call me back regarding my chart message 07/01/22.

## 2022-07-21 NOTE — Telephone Encounter (Signed)
I attached a message to my chart message asking patient for update regarding this. Patient never called back.

## 2022-08-07 NOTE — Telephone Encounter (Signed)
Left message for patient to call, she never replied to my chart message (she did read message)

## 2022-08-14 NOTE — Telephone Encounter (Signed)
Patient never called back, orders placed at breast center to call to schedule.

## 2022-08-15 NOTE — Telephone Encounter (Signed)
[facility]-first attempt to contact pt today. LM

## 2022-08-27 NOTE — Telephone Encounter (Signed)
[facility]-second attempt to contact pt today. LM

## 2022-09-01 DIAGNOSIS — R5382 Chronic fatigue, unspecified: Secondary | ICD-10-CM | POA: Diagnosis not present

## 2022-09-01 DIAGNOSIS — E038 Other specified hypothyroidism: Secondary | ICD-10-CM | POA: Diagnosis not present

## 2022-09-01 DIAGNOSIS — R7989 Other specified abnormal findings of blood chemistry: Secondary | ICD-10-CM | POA: Diagnosis not present

## 2022-09-01 DIAGNOSIS — R635 Abnormal weight gain: Secondary | ICD-10-CM | POA: Diagnosis not present

## 2022-09-10 NOTE — Telephone Encounter (Signed)
Please send letter. If no response may need to d/c from practice.

## 2022-09-10 NOTE — Telephone Encounter (Signed)
Multiple attempts to contact pt and have been unsuccessful. Send letter? Please advise.

## 2022-09-11 NOTE — Telephone Encounter (Signed)
Letter mailed.

## 2022-09-22 DIAGNOSIS — Z634 Disappearance and death of family member: Secondary | ICD-10-CM | POA: Diagnosis not present

## 2022-09-22 DIAGNOSIS — Z566 Other physical and mental strain related to work: Secondary | ICD-10-CM | POA: Diagnosis not present

## 2022-09-22 DIAGNOSIS — F4321 Adjustment disorder with depressed mood: Secondary | ICD-10-CM | POA: Diagnosis not present

## 2022-10-15 NOTE — Telephone Encounter (Signed)
Letter mailed to pt on 09/11/2022. No response from pt and dx breast imaging never scheduled. Please advise.

## 2022-10-15 NOTE — Telephone Encounter (Signed)
Please call and send another letter, if no response we may need to discharge from practice for lack of follow up.

## 2022-10-15 NOTE — Telephone Encounter (Signed)
Letter reviewed and signed by [facility].  Mailed to address on file.

## 2022-10-15 NOTE — Telephone Encounter (Signed)
Letter pended.  Copy of letter to [facility] to review and sign.

## 2022-11-26 NOTE — Telephone Encounter (Signed)
No response from patient.  Routing to provider to review.   Cc: Dr. Talbert Nan

## 2022-11-27 NOTE — Telephone Encounter (Signed)
We have made multiple attempts to reach out, hopefully we can discuss the importance when she schedules her next office visit.

## 2022-11-27 NOTE — Telephone Encounter (Signed)
Encounter reviewed and closed.   Next AEX 01/2023

## 2023-01-16 ENCOUNTER — Ambulatory Visit: Payer: BC Managed Care – PPO | Admitting: Radiology

## 2023-02-05 ENCOUNTER — Ambulatory Visit (INDEPENDENT_AMBULATORY_CARE_PROVIDER_SITE_OTHER): Payer: BC Managed Care – PPO | Admitting: Radiology

## 2023-02-05 ENCOUNTER — Encounter: Payer: Self-pay | Admitting: Radiology

## 2023-02-05 VITALS — BP 126/84 | Ht 60.5 in | Wt 149.0 lb

## 2023-02-05 DIAGNOSIS — N6001 Solitary cyst of right breast: Secondary | ICD-10-CM

## 2023-02-05 DIAGNOSIS — Z113 Encounter for screening for infections with a predominantly sexual mode of transmission: Secondary | ICD-10-CM

## 2023-02-05 DIAGNOSIS — Z01419 Encounter for gynecological examination (general) (routine) without abnormal findings: Secondary | ICD-10-CM | POA: Diagnosis not present

## 2023-02-05 DIAGNOSIS — Z1211 Encounter for screening for malignant neoplasm of colon: Secondary | ICD-10-CM

## 2023-02-05 NOTE — Progress Notes (Signed)
   ROSELINDA BAHENA Nov 14, 1975 [id number]   History:  47 y.o. G0 presents for annual exam. Right breast mass 3/23 mammogram was suspicious, did not have follow up. Area has not changed. Would like STI screening today. No new gyn concerns.  Gynecologic History Patient's last menstrual period was 01/29/2023 (exact date). Period Cycle (Days): 28 Period Duration (Days): 8 Period Pattern: Regular Menstrual Flow: Heavy Menstrual Control: Maxi pad, Thin pad Dysmenorrhea: (!) Moderate Dysmenorrhea Symptoms: Cramping Contraception/Family planning: condoms Sexually active: yes Last Pap: 2023. Results were: normal Last mammogram: 2023. Results were: abnormal  Obstetric History OB History  Gravida Para Term Preterm AB Living  0 0 0 0 0 0  SAB IAB Ectopic Multiple Live Births  0 0 0 0 0     The following portions of the patient's history were reviewed and updated as appropriate: allergies, current medications, past family history, past medical history, past social history, past surgical history, and problem list.  Review of Systems Pertinent items noted in HPI and remainder of comprehensive ROS otherwise negative.   Past medical history, past surgical history, family history and social history were all reviewed and documented in the EPIC chart.   Exam:  Vitals:   02/05/23 1600  BP: 126/84  Weight: 149 lb (67.6 kg)  Height: 5' 0.5" (1.537 m)   Body mass index is 28.62 kg/m.  General appearance:  Normal Thyroid:  Symmetrical, normal in size, without palpable masses or nodularity. Respiratory  Auscultation:  Clear without wheezing or rhonchi Cardiovascular  Auscultation:  Regular rate, without rubs, murmurs or gallops  Edema/varicosities:  Not grossly evident Abdominal  Soft,nontender, without masses, guarding or rebound.  Liver/spleen:  No organomegaly noted  Hernia:  None appreciated  Skin  Inspection:  Grossly normal Breasts: Examined lying and sitting.   Right: 2cm mass  12oclock c/w with previous imaging   Left: Without masses, retractions, nipple discharge or axillary adenopathy. Genitourinary   Inguinal/mons:  Normal without inguinal adenopathy  External genitalia:  Normal appearing vulva with no masses, tenderness, or lesions  BUS/Urethra/Skene's glands:  Normal without masses or exudate  Vagina:  Normal appearing with normal color and discharge, no lesions  Cervix:  Normal appearing without discharge or lesions  Uterus:  Normal in size, shape and contour.  Mobile, nontender  Adnexa/parametria:     Rt: Normal in size, without masses or tenderness.   Lt: Normal in size, without masses or tenderness.  Anus and perineum: Normal   Raynelle Fanning, CMA present for exam  Assessment/Plan:   1. Well woman exam with routine gynecological exam Pap due 2028  2. Breast cyst, right Persistent, needs follow up scheduled- recommended further imaging at last mammogram, stressed the importance of follow up. Agrees to call to schedule today.  3. Screening for STDs (sexually transmitted diseases) - SURESWAB CT/NG/T. vaginalis  4. Screen for colon cancer - Ambulatory referral to Gastroenterology    Discussed SBE, colonoscopy and DEXA screening as directed/appropriate. Recommend of exercise weekly, including weight bearing exercise. Encouraged the use of seatbelts and sunscreen. Return in 1 year for annual or as needed.   Arlie Solomons B WHNP-BC 4:16 PM 02/05/2023

## 2023-02-06 LAB — SURESWAB CT/NG/T. VAGINALIS
C. trachomatis RNA, TMA: NOT DETECTED
N. gonorrhoeae RNA, TMA: NOT DETECTED
Trichomonas vaginalis RNA: NOT DETECTED

## 2023-02-11 NOTE — Telephone Encounter (Signed)
Order for Bilateral Dx MMG and R BR [place] to Jami to sign and be faxed to [facility].   Routing to provider for final review. Patient is agreeable to disposition. Will close encounter.

## 2024-01-30 IMAGING — MG MM BREAST LOCALIZATION CLIP
4 series · 4 of 12 positions shown · non-contrast
Comparison: Previous exam(s).

CLINICAL DATA: Post procedure mammogram for clip placement

EXAM:
3D DIAGNOSTIC RIGHT MAMMOGRAM POST ULTRASOUND BIOPSY

[R ML synth-2D]
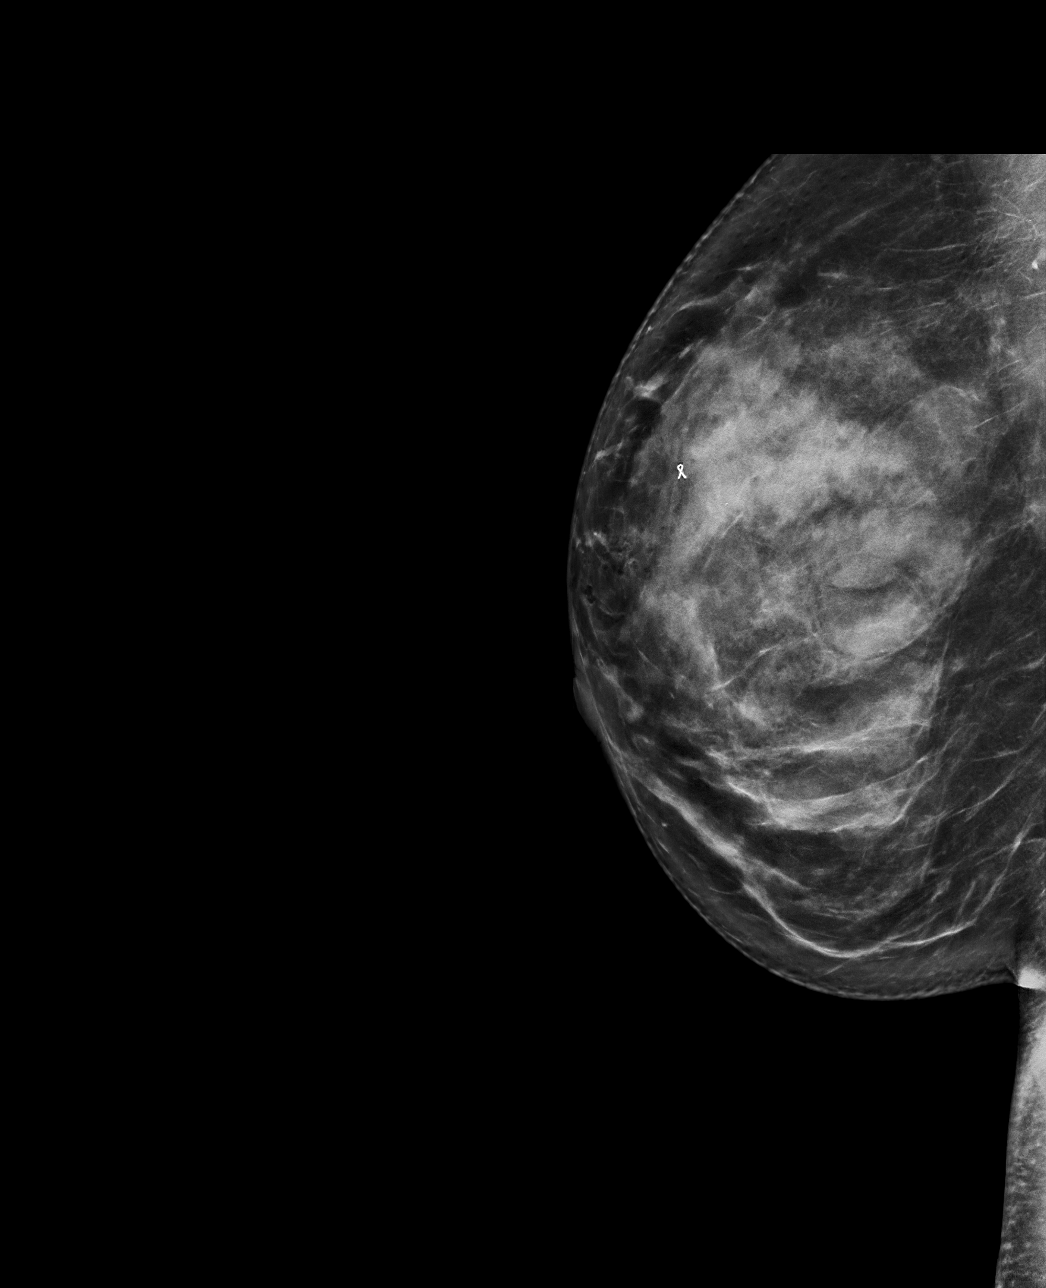

[R CC synth-2D]
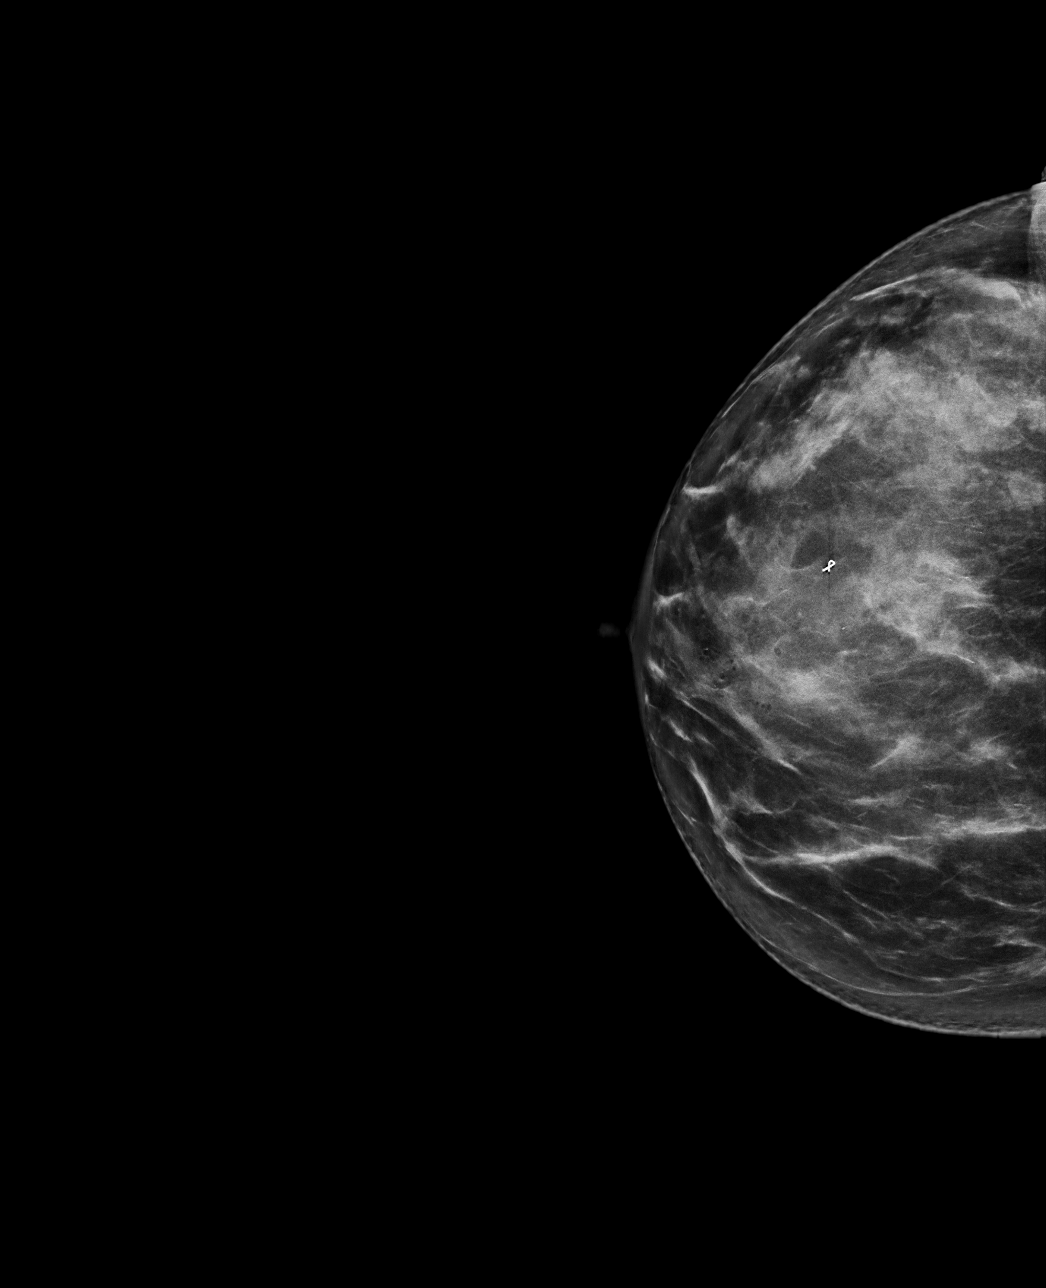

[R ML tomo · tomo slice 47/92.0]
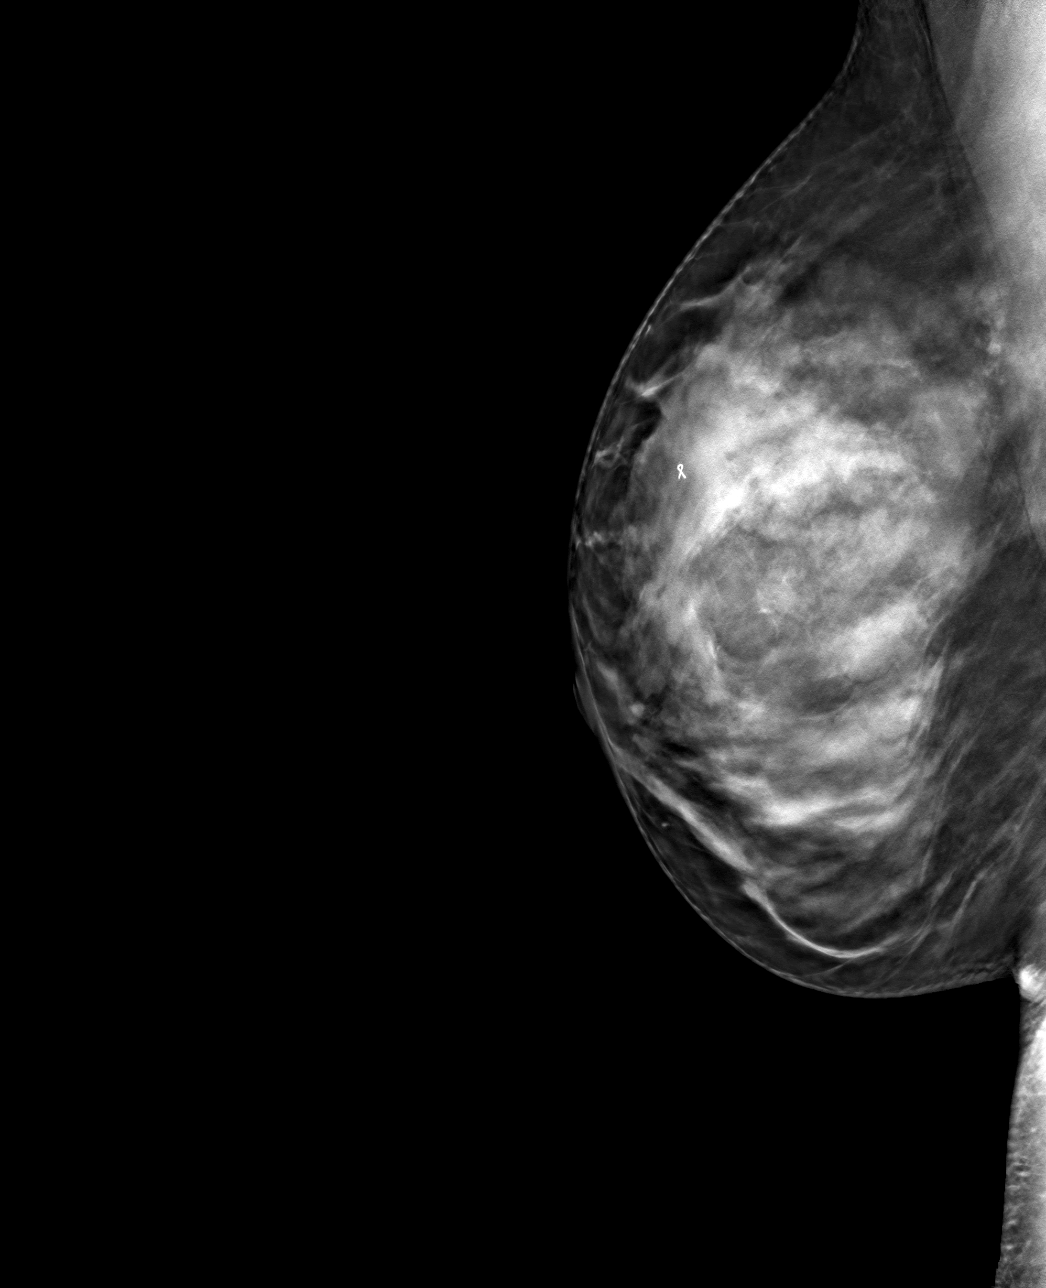

[R CC tomo · tomo slice 43/86.0]
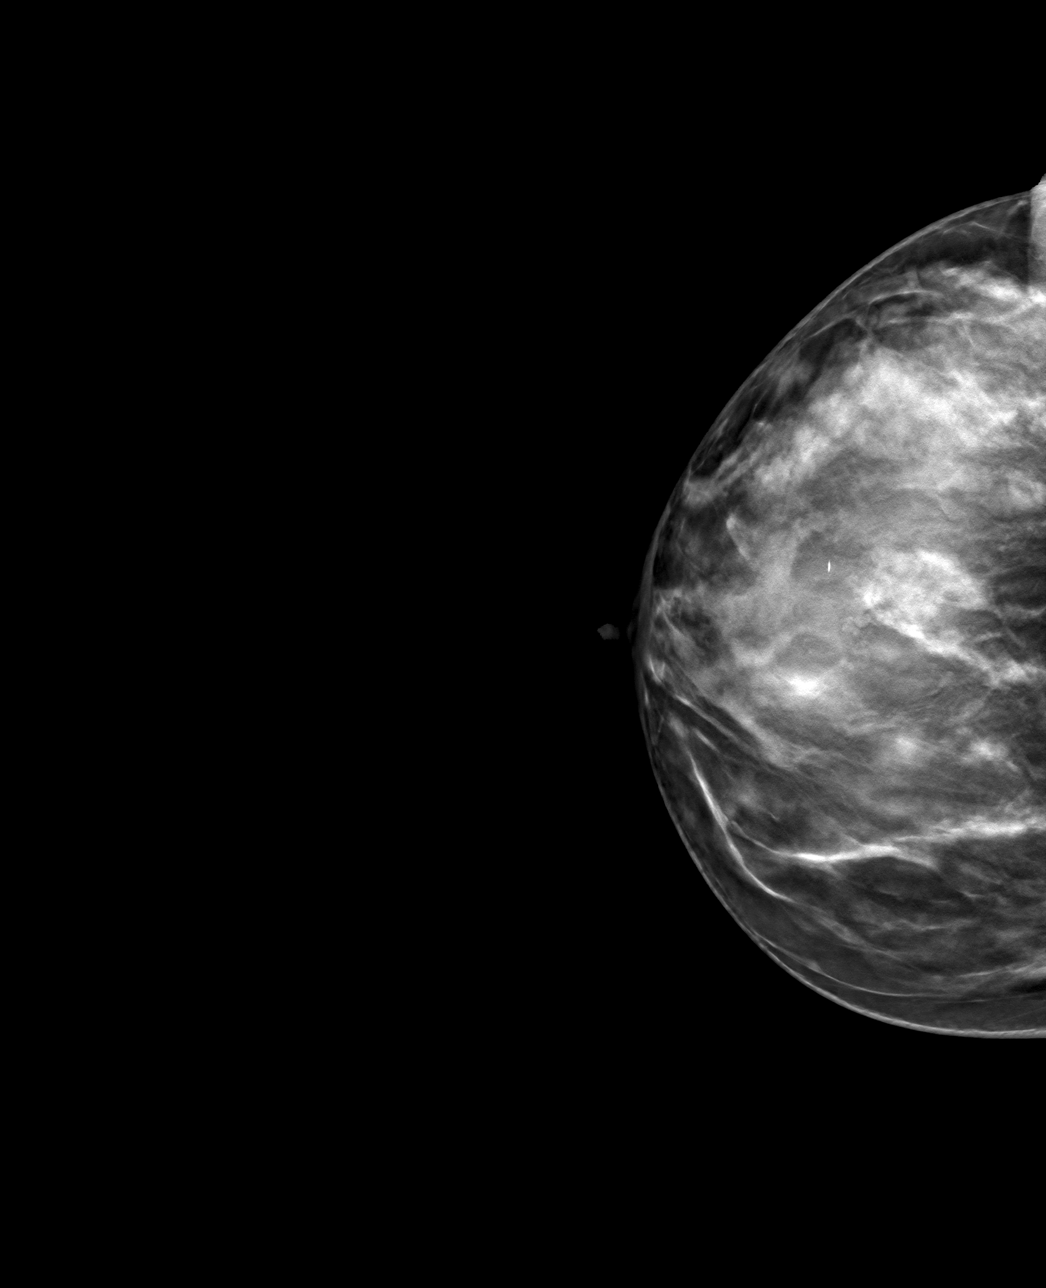

[4 of 12 positions shown; findings below may reference images not displayed]

FINDINGS: 3D Mammographic images were obtained following ultrasound guided
biopsy of a mass in the right breast at 12 o'clock. The biopsy
marking clip is in expected position at the site of biopsy.
IMPRESSION: Appropriate positioning of the ribbon shaped biopsy marking clip at
the site of biopsy in the right breast at 12 o'clock.

Final Assessment: Post Procedure Mammograms for Marker Placement

## 2024-01-30 IMAGING — US US  BREAST BX W/ LOC DEV 1ST LESION IMG BX SPEC US GUIDE*R*
1 series · 12 of 15 positions shown · non-contrast
Comparison: Previous exam(s).
COMPARISON: Previous exam(s).

Addendum:
CLINICAL DATA: 45-year-old female presenting for
aspiration/possible biopsy of a right breast cyst.

EXAM:
ULTRASOUND GUIDED RIGHT BREAST CORE NEEDLE BIOPSY

[Series 1: us breast bx w/ loc dev 1st lesion img bx spec us  · 0.07mm/px · 12 of 15 slices shown]
[im 1/15]
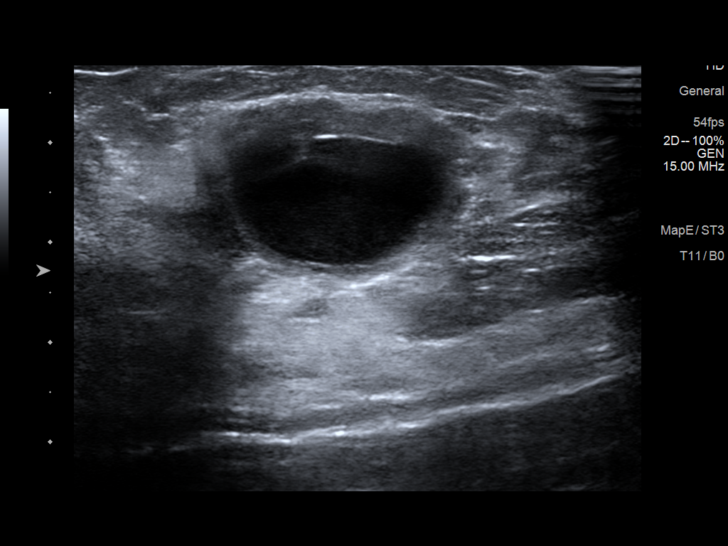
[im 2/15]
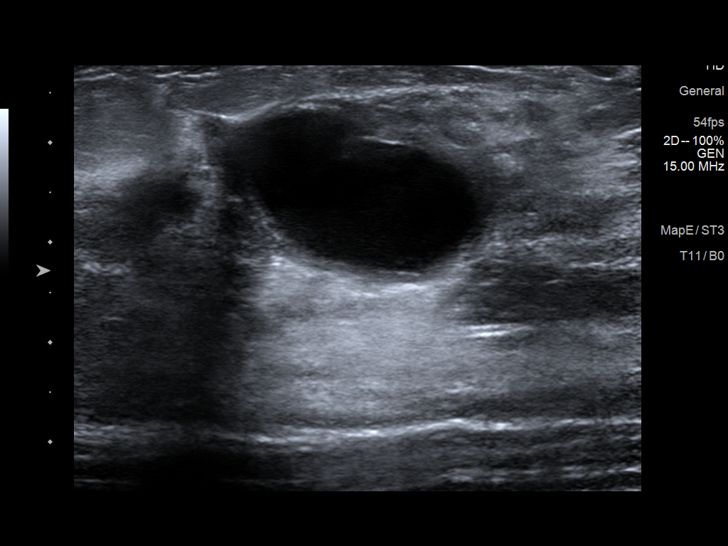
[im 4/15]
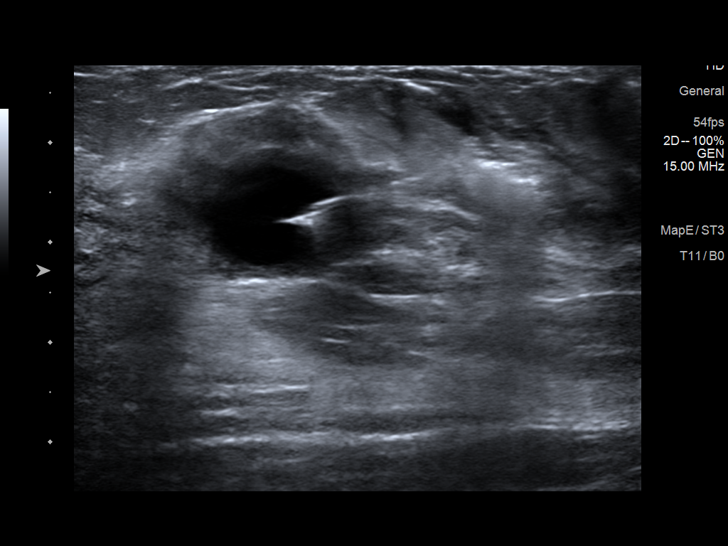
[im 5/15]
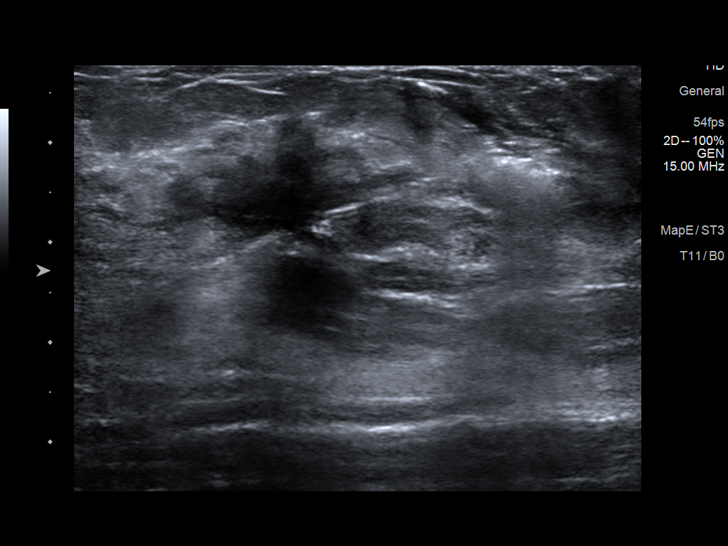
[im 6/15]
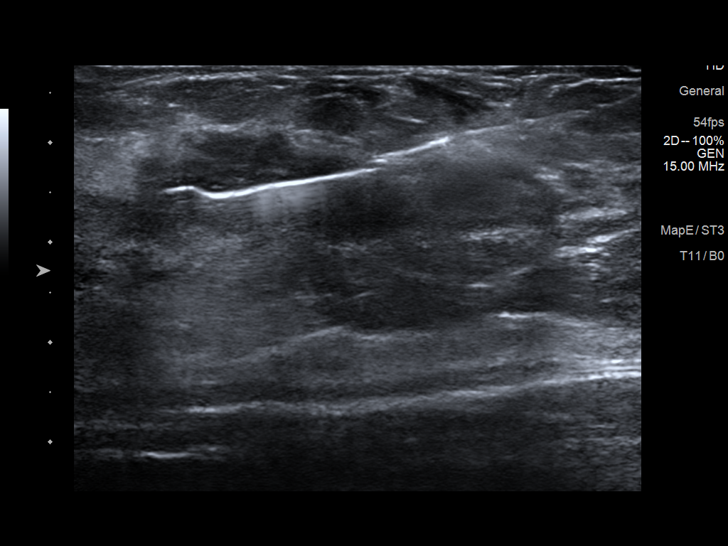
[im 7/15]
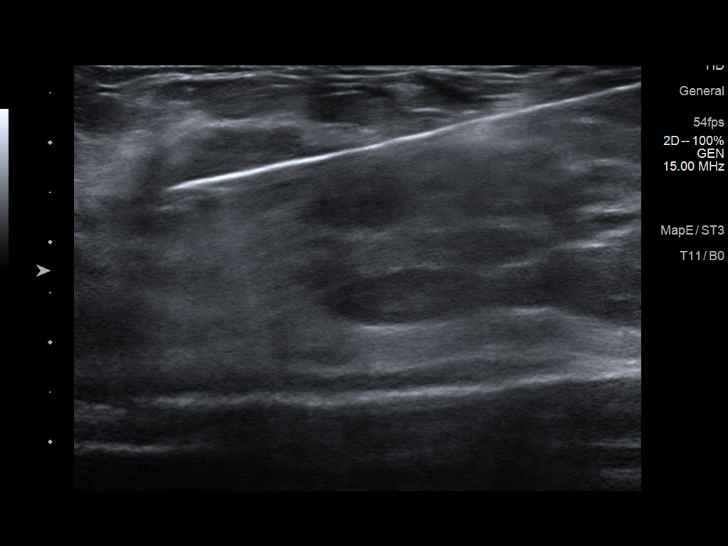
[im 9/15]
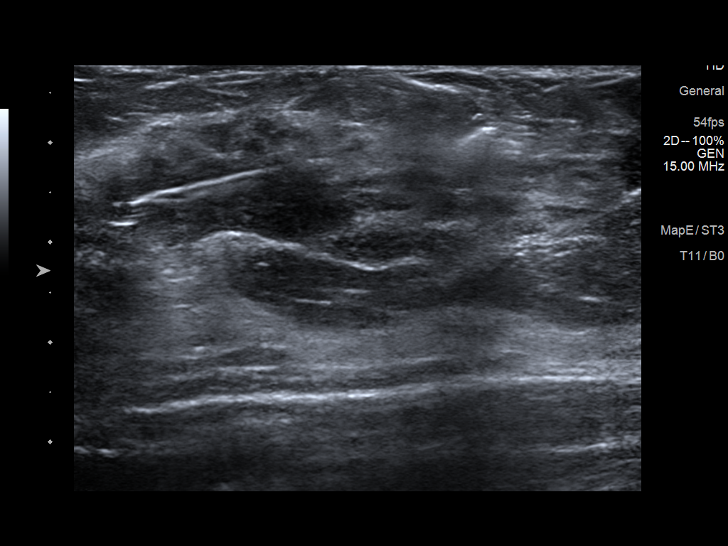
[im 10/15]
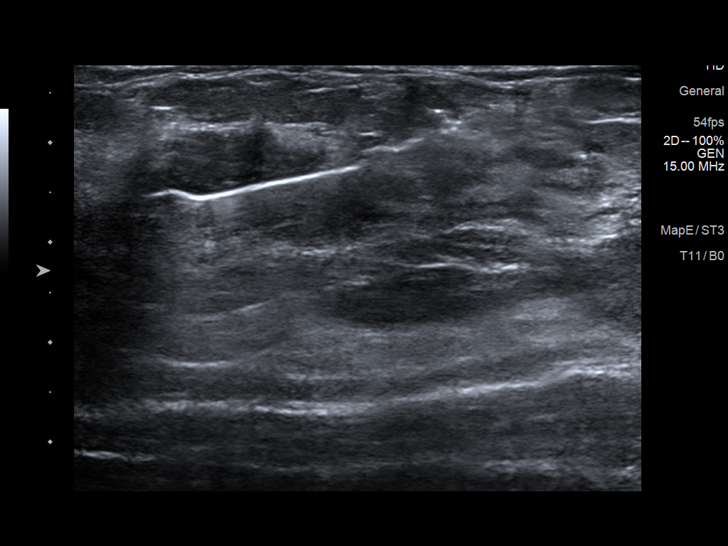
[im 11/15]
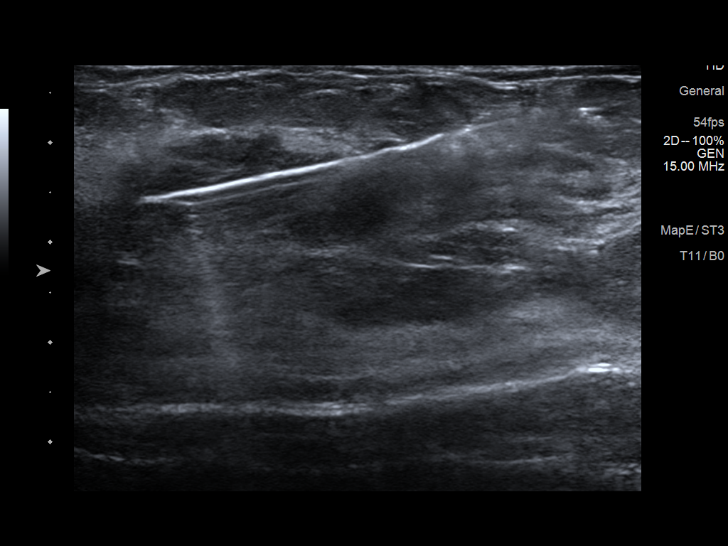
[im 12/15]
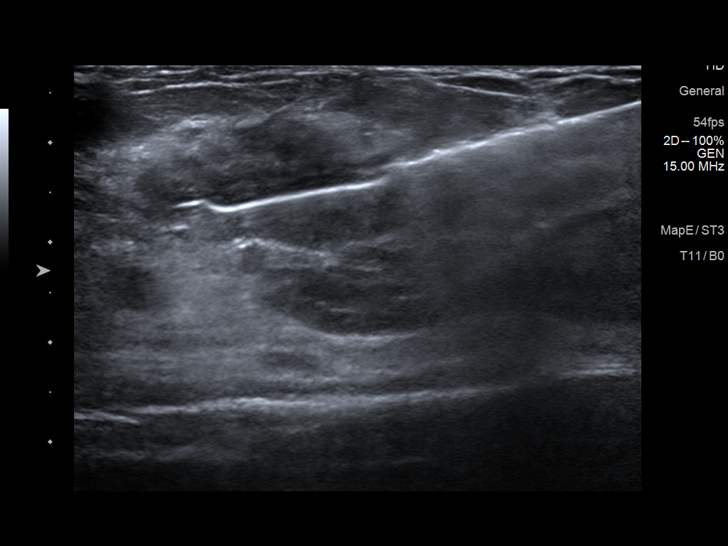
[im 14/15]
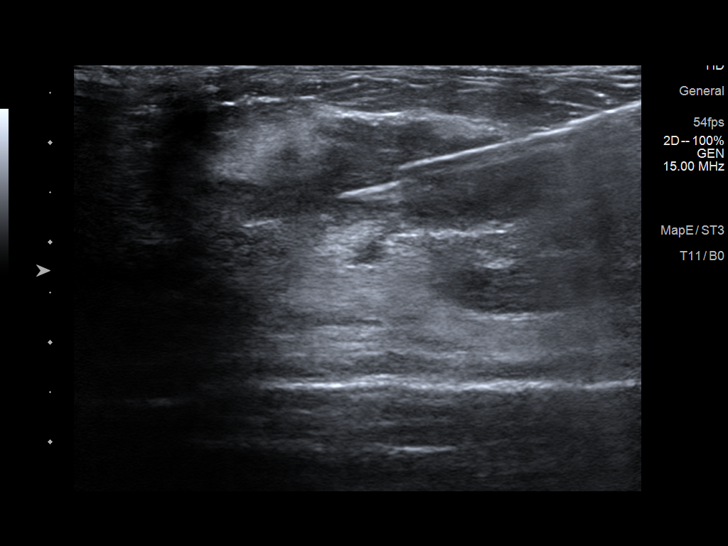
[im 15/15]
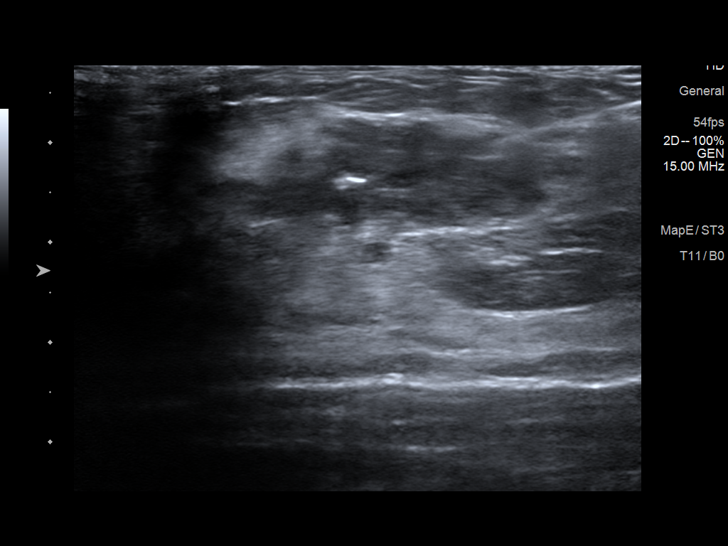

[12 of 15 positions shown; findings below may reference images not displayed]



Lesion quadrant: Upper outer quadrant

Using sterile technique and 1% Lidocaine as local anesthetic, under
direct ultrasound visualization, aspiration of the cyst in the right
breast at 12 o'clock was performed. Approximately 5 cc of cloudy
fluid was obtained. The cyst did not completely aspirate therefore
the procedure was converted to a biopsy. Under direct ultrasound
visualization a 14 gauge Bogatsu device was used to perform
biopsy of the partially aspirated cyst in the right breast at 12
o'clock using a lateral approach. At the conclusion of the procedure
a ribbon tissue marker clip was deployed into the biopsy cavity.
Follow up 2 view mammogram was performed and dictated separately.
IMPRESSION: Ultrasound guided biopsy of a mass in the right breast at 12
o'clock. No apparent complications.

ADDENDUM:
Pathology revealed FIBROSIS WITH ACUTE AND CHRONIC INFLAMMATION AND
HEMORRHAGE, WITH FEATURES CHARACTERISTIC FOR CYST WALL TISSUE of the
RIGHT breast, 12 o'clock, 3 cmfn, (ribbon clip). This was found to
be concordant by Dr. Danya Macon.

Pathology results were discussed with the patient by telephone. The
patient reported doing well after the biopsy with tenderness at the
site. Post biopsy instructions and care were reviewed and questions
were answered. The patient was encouraged to call The [REDACTED] for any additional concerns. My direct phone
number was provided.

The patient was asked to return for a RIGHT breast ultrasound in 6
months and informed a reminder notice would be sent regarding this
appointment.

Pathology results reported by Radyn Harun, RN on 11/13/2021.



Lesion quadrant: Upper outer quadrant

Using sterile technique and 1% Lidocaine as local anesthetic, under
direct ultrasound visualization, aspiration of the cyst in the right
breast at 12 o'clock was performed. Approximately 5 cc of cloudy
fluid was obtained. The cyst did not completely aspirate therefore
the procedure was converted to a biopsy. Under direct ultrasound
visualization a 14 gauge Bogatsu device was used to perform
biopsy of the partially aspirated cyst in the right breast at 12
o'clock using a lateral approach. At the conclusion of the procedure
a ribbon tissue marker clip was deployed into the biopsy cavity.
Follow up 2 view mammogram was performed and dictated separately.
IMPRESSION: Ultrasound guided biopsy of a mass in the right breast at 12
o'clock. No apparent complications.
